# Patient Record
Sex: Female | Born: 1975 | Race: Black or African American | Hispanic: No | Smoking: Never smoker
Health system: Southern US, Community
[De-identification: ages and names within clinical notes are randomized; demographics above are authoritative.]

## PROBLEM LIST (undated history)

## (undated) DIAGNOSIS — J029 Acute pharyngitis, unspecified: Secondary | ICD-10-CM

## (undated) DIAGNOSIS — E876 Hypokalemia: Secondary | ICD-10-CM

## (undated) DIAGNOSIS — D509 Iron deficiency anemia, unspecified: Secondary | ICD-10-CM

## (undated) DIAGNOSIS — I10 Essential (primary) hypertension: Secondary | ICD-10-CM

## (undated) DIAGNOSIS — E010 Iodine-deficiency related diffuse (endemic) goiter: Secondary | ICD-10-CM

## (undated) DIAGNOSIS — F419 Anxiety disorder, unspecified: Secondary | ICD-10-CM

## (undated) DIAGNOSIS — D649 Anemia, unspecified: Secondary | ICD-10-CM

## (undated) DIAGNOSIS — F4310 Post-traumatic stress disorder, unspecified: Secondary | ICD-10-CM

## (undated) DIAGNOSIS — R61 Generalized hyperhidrosis: Secondary | ICD-10-CM

## (undated) HISTORY — DX: Anemia, unspecified: D64.9

## (undated) HISTORY — DX: Acute pharyngitis, unspecified: J02.9

## (undated) HISTORY — DX: Iodine-deficiency related diffuse (endemic) goiter: E01.0

## (undated) HISTORY — DX: Essential (primary) hypertension: I10

## (undated) HISTORY — DX: Iron deficiency anemia, unspecified: D50.9

## (undated) HISTORY — DX: Hypokalemia: E87.6

## (undated) HISTORY — DX: Generalized hyperhidrosis: R61

---

## 1997-07-15 ENCOUNTER — Emergency Department (HOSPITAL_COMMUNITY): Admission: EM | Admit: 1997-07-15 | Discharge: 1997-07-15 | Payer: Self-pay | Admitting: Emergency Medicine

## 1998-05-26 ENCOUNTER — Emergency Department (HOSPITAL_COMMUNITY): Admission: EM | Admit: 1998-05-26 | Discharge: 1998-05-26 | Payer: Self-pay | Admitting: Emergency Medicine

## 1998-11-06 ENCOUNTER — Encounter: Payer: Self-pay | Admitting: Emergency Medicine

## 1998-11-06 ENCOUNTER — Emergency Department (HOSPITAL_COMMUNITY): Admission: EM | Admit: 1998-11-06 | Discharge: 1998-11-06 | Payer: Self-pay | Admitting: Internal Medicine

## 1998-11-15 ENCOUNTER — Encounter: Admission: RE | Admit: 1998-11-15 | Discharge: 1998-11-15 | Payer: Self-pay | Admitting: Internal Medicine

## 1998-11-15 ENCOUNTER — Ambulatory Visit (HOSPITAL_COMMUNITY): Admission: RE | Admit: 1998-11-15 | Discharge: 1998-11-15 | Payer: Self-pay | Admitting: Internal Medicine

## 1998-11-16 ENCOUNTER — Ambulatory Visit (HOSPITAL_COMMUNITY): Admission: RE | Admit: 1998-11-16 | Discharge: 1998-11-16 | Payer: Self-pay | Admitting: *Deleted

## 1998-11-28 ENCOUNTER — Encounter: Admission: RE | Admit: 1998-11-28 | Discharge: 1998-11-28 | Payer: Self-pay | Admitting: Internal Medicine

## 1998-12-29 ENCOUNTER — Encounter: Admission: RE | Admit: 1998-12-29 | Discharge: 1998-12-29 | Payer: Self-pay | Admitting: Internal Medicine

## 1999-01-11 ENCOUNTER — Encounter: Admission: RE | Admit: 1999-01-11 | Discharge: 1999-01-11 | Payer: Self-pay | Admitting: Obstetrics

## 1999-06-05 ENCOUNTER — Emergency Department (HOSPITAL_COMMUNITY): Admission: EM | Admit: 1999-06-05 | Discharge: 1999-06-05 | Payer: Self-pay | Admitting: Emergency Medicine

## 1999-06-08 ENCOUNTER — Ambulatory Visit (HOSPITAL_COMMUNITY): Admission: RE | Admit: 1999-06-08 | Discharge: 1999-06-08 | Payer: Self-pay | Admitting: Emergency Medicine

## 1999-09-24 ENCOUNTER — Emergency Department (HOSPITAL_COMMUNITY): Admission: EM | Admit: 1999-09-24 | Discharge: 1999-09-24 | Payer: Self-pay | Admitting: *Deleted

## 1999-10-04 ENCOUNTER — Encounter: Payer: Self-pay | Admitting: Emergency Medicine

## 1999-10-04 ENCOUNTER — Emergency Department (HOSPITAL_COMMUNITY): Admission: EM | Admit: 1999-10-04 | Discharge: 1999-10-04 | Payer: Self-pay | Admitting: Emergency Medicine

## 1999-12-09 ENCOUNTER — Encounter: Payer: Self-pay | Admitting: Emergency Medicine

## 1999-12-09 ENCOUNTER — Emergency Department (HOSPITAL_COMMUNITY): Admission: EM | Admit: 1999-12-09 | Discharge: 1999-12-09 | Payer: Self-pay | Admitting: Emergency Medicine

## 2000-02-29 ENCOUNTER — Emergency Department (HOSPITAL_COMMUNITY): Admission: EM | Admit: 2000-02-29 | Discharge: 2000-02-29 | Payer: Self-pay | Admitting: Emergency Medicine

## 2000-11-22 ENCOUNTER — Emergency Department (HOSPITAL_COMMUNITY): Admission: EM | Admit: 2000-11-22 | Discharge: 2000-11-22 | Payer: Self-pay | Admitting: Emergency Medicine

## 2000-12-26 ENCOUNTER — Emergency Department (HOSPITAL_COMMUNITY): Admission: EM | Admit: 2000-12-26 | Discharge: 2000-12-27 | Payer: Self-pay | Admitting: Emergency Medicine

## 2001-01-06 ENCOUNTER — Emergency Department (HOSPITAL_COMMUNITY): Admission: EM | Admit: 2001-01-06 | Discharge: 2001-01-06 | Payer: Self-pay | Admitting: Emergency Medicine

## 2001-02-16 ENCOUNTER — Emergency Department (HOSPITAL_COMMUNITY): Admission: EM | Admit: 2001-02-16 | Discharge: 2001-02-16 | Payer: Self-pay | Admitting: Emergency Medicine

## 2002-05-29 ENCOUNTER — Emergency Department (HOSPITAL_COMMUNITY): Admission: EM | Admit: 2002-05-29 | Discharge: 2002-05-29 | Payer: Self-pay | Admitting: Emergency Medicine

## 2002-06-14 ENCOUNTER — Emergency Department (HOSPITAL_COMMUNITY): Admission: EM | Admit: 2002-06-14 | Discharge: 2002-06-14 | Payer: Self-pay | Admitting: Emergency Medicine

## 2002-09-16 ENCOUNTER — Emergency Department (HOSPITAL_COMMUNITY): Admission: EM | Admit: 2002-09-16 | Discharge: 2002-09-16 | Payer: Self-pay | Admitting: Emergency Medicine

## 2002-09-27 ENCOUNTER — Inpatient Hospital Stay (HOSPITAL_COMMUNITY): Admission: AD | Admit: 2002-09-27 | Discharge: 2002-09-27 | Payer: Self-pay | Admitting: Obstetrics & Gynecology

## 2002-09-28 ENCOUNTER — Inpatient Hospital Stay (HOSPITAL_COMMUNITY): Admission: AD | Admit: 2002-09-28 | Discharge: 2002-09-28 | Payer: Self-pay | Admitting: *Deleted

## 2002-09-29 ENCOUNTER — Inpatient Hospital Stay (HOSPITAL_COMMUNITY): Admission: AD | Admit: 2002-09-29 | Discharge: 2002-09-29 | Payer: Self-pay | Admitting: Obstetrics and Gynecology

## 2002-10-01 ENCOUNTER — Inpatient Hospital Stay (HOSPITAL_COMMUNITY): Admission: AD | Admit: 2002-10-01 | Discharge: 2002-10-01 | Payer: Self-pay | Admitting: Obstetrics & Gynecology

## 2002-10-03 ENCOUNTER — Inpatient Hospital Stay (HOSPITAL_COMMUNITY): Admission: AD | Admit: 2002-10-03 | Discharge: 2002-10-03 | Payer: Self-pay | Admitting: Obstetrics & Gynecology

## 2002-10-14 ENCOUNTER — Inpatient Hospital Stay (HOSPITAL_COMMUNITY): Admission: AD | Admit: 2002-10-14 | Discharge: 2002-10-14 | Payer: Self-pay | Admitting: Obstetrics & Gynecology

## 2002-10-24 ENCOUNTER — Inpatient Hospital Stay (HOSPITAL_COMMUNITY): Admission: AD | Admit: 2002-10-24 | Discharge: 2002-10-24 | Payer: Self-pay | Admitting: Obstetrics & Gynecology

## 2002-11-04 ENCOUNTER — Emergency Department (HOSPITAL_COMMUNITY): Admission: EM | Admit: 2002-11-04 | Discharge: 2002-11-04 | Payer: Self-pay | Admitting: Emergency Medicine

## 2003-05-01 ENCOUNTER — Emergency Department (HOSPITAL_COMMUNITY): Admission: EM | Admit: 2003-05-01 | Discharge: 2003-05-01 | Payer: Self-pay | Admitting: Emergency Medicine

## 2003-11-29 ENCOUNTER — Emergency Department (HOSPITAL_COMMUNITY): Admission: EM | Admit: 2003-11-29 | Discharge: 2003-11-29 | Payer: Self-pay | Admitting: Emergency Medicine

## 2004-02-15 ENCOUNTER — Emergency Department (HOSPITAL_COMMUNITY): Admission: EM | Admit: 2004-02-15 | Discharge: 2004-02-15 | Payer: Self-pay | Admitting: Emergency Medicine

## 2004-11-22 ENCOUNTER — Emergency Department (HOSPITAL_COMMUNITY): Admission: EM | Admit: 2004-11-22 | Discharge: 2004-11-22 | Payer: Self-pay | Admitting: Emergency Medicine

## 2005-07-16 ENCOUNTER — Emergency Department (HOSPITAL_COMMUNITY): Admission: EM | Admit: 2005-07-16 | Discharge: 2005-07-16 | Payer: Self-pay | Admitting: Emergency Medicine

## 2007-03-11 ENCOUNTER — Emergency Department (HOSPITAL_COMMUNITY): Admission: EM | Admit: 2007-03-11 | Discharge: 2007-03-11 | Payer: Self-pay | Admitting: Family Medicine

## 2007-05-18 ENCOUNTER — Emergency Department (HOSPITAL_COMMUNITY): Admission: EM | Admit: 2007-05-18 | Discharge: 2007-05-18 | Payer: Self-pay | Admitting: Emergency Medicine

## 2009-08-01 ENCOUNTER — Emergency Department (HOSPITAL_COMMUNITY): Admission: EM | Admit: 2009-08-01 | Discharge: 2009-08-01 | Payer: Self-pay | Admitting: Emergency Medicine

## 2010-03-13 ENCOUNTER — Emergency Department (HOSPITAL_COMMUNITY)
Admission: EM | Admit: 2010-03-13 | Discharge: 2010-03-13 | Disposition: A | Discharge status: Home / Self Care | Payer: Self-pay | Attending: Emergency Medicine | Admitting: Emergency Medicine

## 2010-03-13 DIAGNOSIS — J329 Chronic sinusitis, unspecified: Secondary | ICD-10-CM | POA: Insufficient documentation

## 2010-09-16 ENCOUNTER — Emergency Department (HOSPITAL_COMMUNITY)
Admission: EM | Admit: 2010-09-16 | Discharge: 2010-09-16 | Disposition: A | Discharge status: Home / Self Care | Payer: Self-pay | Attending: Emergency Medicine | Admitting: Emergency Medicine

## 2010-09-16 ENCOUNTER — Emergency Department (HOSPITAL_COMMUNITY): Payer: Self-pay

## 2010-09-16 DIAGNOSIS — R079 Chest pain, unspecified: Secondary | ICD-10-CM | POA: Insufficient documentation

## 2010-09-16 DIAGNOSIS — I517 Cardiomegaly: Secondary | ICD-10-CM | POA: Insufficient documentation

## 2010-09-16 LAB — DIFFERENTIAL
Eosinophils Absolute: 0.1 10*3/uL (ref 0.0–0.7)
Eosinophils Relative: 1 % (ref 0–5)
Lymphs Abs: 2.4 10*3/uL (ref 0.7–4.0)
Monocytes Relative: 3 % (ref 3–12)

## 2010-09-16 LAB — CBC
MCH: 26.2 pg (ref 26.0–34.0)
MCHC: 33 g/dL (ref 30.0–36.0)
MCV: 79.5 fL (ref 78.0–100.0)
Platelets: 267 10*3/uL (ref 150–400)
RDW: 14.5 % (ref 11.5–15.5)

## 2010-10-01 ENCOUNTER — Encounter: Payer: Self-pay | Admitting: *Deleted

## 2010-10-02 ENCOUNTER — Ambulatory Visit (INDEPENDENT_AMBULATORY_CARE_PROVIDER_SITE_OTHER): Payer: Self-pay | Admitting: Cardiology

## 2010-10-02 ENCOUNTER — Encounter: Payer: Self-pay | Admitting: Cardiology

## 2010-10-02 DIAGNOSIS — R072 Precordial pain: Secondary | ICD-10-CM

## 2010-10-02 DIAGNOSIS — E049 Nontoxic goiter, unspecified: Secondary | ICD-10-CM

## 2010-10-02 DIAGNOSIS — E010 Iodine-deficiency related diffuse (endemic) goiter: Secondary | ICD-10-CM | POA: Insufficient documentation

## 2010-10-02 DIAGNOSIS — R079 Chest pain, unspecified: Secondary | ICD-10-CM | POA: Insufficient documentation

## 2010-10-02 DIAGNOSIS — I10 Essential (primary) hypertension: Secondary | ICD-10-CM

## 2010-10-02 LAB — BASIC METABOLIC PANEL
BUN: 11 mg/dL (ref 6–23)
CO2: 29 mEq/L (ref 19–32)
Chloride: 107 mEq/L (ref 96–112)
Creatinine, Ser: 0.8 mg/dL (ref 0.4–1.2)
Glucose, Bld: 76 mg/dL (ref 70–99)

## 2010-10-02 LAB — TSH: TSH: 0.78 u[IU]/mL (ref 0.35–5.50)

## 2010-10-02 MED ORDER — LISINOPRIL-HYDROCHLOROTHIAZIDE 10-12.5 MG PO TABS: 1.0000 | ORAL_TABLET | Freq: Every day | ORAL | Status: DC

## 2010-10-02 NOTE — Assessment & Plan Note (Signed)
Possible thyromegaly on examination. Check TSH. Patient needs to establish with a primary care physician.

## 2010-10-02 NOTE — Assessment & Plan Note (Signed)
Symptoms atypical. She is also complaining of dyspnea on exertion. Schedule stress echocardiogram to quantify LV function and to exclude ischemia.

## 2010-10-02 NOTE — Patient Instructions (Signed)
Your physician recommends that you schedule a follow-up appointment in: 8 WEEKS  Your physician has requested that you have a stress echocardiogram. For further information please visit [email or web address] Please follow instruction sheet as given.   Your physician recommends that you return for lab work in: TODAY AND IN ONE WEEK  START  LISINOPRIL/HCTZ 10-12.5 MG ONCE DAILY  REFERRAL TO PRIMARY CARE TO ESTABLISH-[address]

## 2010-10-02 NOTE — Progress Notes (Signed)
HPI: 35 yo female for evaluation of chest pain. Seen in the ER 09/16/10 for evaluation of chest pain. Chest xray no edema; Hgb 11.5; Troponin neg. Patient states that on the day she was evaluated she developed left upper chest pain radiating to her left upper extremity. It was described as a sharp pain. It lasted one hour and resolved spontaneously. It increased with certain movements. No nausea or diaphoresis. She also had a headache and in the emergency room her blood pressure was noted to be in the 190s. She therefore presented for further evaluation. She has not had chest pain since then. She has noted dyspnea on exertion but no orthopnea, PND, pedal edema, palpitations.  Current Outpatient Prescriptions  Medication Sig Dispense Refill  . Aspirin-Acetaminophen-Caffeine (EXCEDRIN PO) Take by mouth as needed.        Marland Kitchen ibuprofen (ADVIL,MOTRIN) 200 MG tablet Take 200 mg by mouth every 6 (six) hours as needed.          No Known Allergies  Past Medical History  Diagnosis Date  . Hypertension     No past surgical history on file.  History   Social History  . Marital Status: Single    Spouse Name: N/A    Number of Children: 0  . Years of Education: N/A   Occupational History  .      Works at [employer]   Social History Main Topics  . Smoking status: Never Smoker   . Smokeless tobacco: Not on file  . Alcohol Use: No  . Drug Use: No  . Sexually Active: Not on file   Other Topics Concern  . Not on file   Social History Narrative  . No narrative on file    Family History  Problem Relation Age of Onset  . Hypertension Mother     ROS: no fevers or chills, productive cough, hemoptysis, dysphasia, odynophagia, melena, hematochezia, dysuria, hematuria, rash, seizure activity, orthopnea, PND, pedal edema, claudication. Remaining systems are negative.  Physical Exam: General:  Well developed/well nourished in NAD Skin warm/dry Patient not depressed No peripheral  clubbing Back-normal HEENT-normal/normal eyelids Neck supple/normal carotid upstroke bilaterally; no bruits; no JVD; Possible thyromegaly chest - CTA/ normal expansion CV - RRR/normal S1 and S2; no rubs or gallops;  PMI nondisplaced; 1/6 systolic ejection murmur Abdomen -NT/ND, no HSM, no mass, + bowel sounds, no bruit 2+ femoral pulses, no bruits Ext-no edema, chords, 2+ DP Neuro-grossly nonfocal  ECG 09/16/10 - Sinus with RV conduction delay

## 2010-10-02 NOTE — Assessment & Plan Note (Signed)
Check TSH, potassium and renal function. Add lisinopril HCT 10/12.5 daily. Check potassium and renal function in one week. Advanced medications as needed. Patient instructed on lifestyle modification.

## 2010-10-03 LAB — CBC
HCT: 37.5
MCV: 84.8
Platelets: 259
RBC: 4.42
WBC: 7.1

## 2010-10-03 LAB — BASIC METABOLIC PANEL
BUN: 12
CO2: 27
Calcium: 9
GFR calc non Af Amer: >60
Glucose, Bld: 79
Sodium: 138

## 2010-10-03 LAB — DIFFERENTIAL
Eosinophils Absolute: 0.1
Lymphocytes Relative: 41
Lymphs Abs: 2.9
Monocytes Relative: 6
Neutrophils Relative %: 52

## 2010-10-12 ENCOUNTER — Other Ambulatory Visit: Payer: Self-pay | Admitting: *Deleted

## 2010-10-12 ENCOUNTER — Other Ambulatory Visit (HOSPITAL_COMMUNITY): Payer: Self-pay

## 2010-10-12 ENCOUNTER — Other Ambulatory Visit (HOSPITAL_COMMUNITY): Payer: Self-pay | Admitting: Radiology

## 2010-10-18 ENCOUNTER — Other Ambulatory Visit (HOSPITAL_COMMUNITY): Payer: Self-pay

## 2010-10-18 ENCOUNTER — Other Ambulatory Visit (HOSPITAL_COMMUNITY): Payer: Self-pay | Admitting: Radiology

## 2010-10-18 ENCOUNTER — Other Ambulatory Visit: Payer: Self-pay | Admitting: *Deleted

## 2010-10-29 ENCOUNTER — Telehealth: Payer: Self-pay

## 2010-10-29 NOTE — Telephone Encounter (Signed)
Pt was a NOS for Echo on 10/11 called to rsc appt. No answer unable to leave message.Scarlette Ar

## 2010-11-26 ENCOUNTER — Encounter: Payer: Self-pay | Admitting: *Deleted

## 2010-11-26 ENCOUNTER — Encounter: Payer: Self-pay | Admitting: Cardiology

## 2010-11-27 ENCOUNTER — Ambulatory Visit: Payer: Self-pay | Admitting: Cardiology

## 2010-12-07 ENCOUNTER — Ambulatory Visit: Payer: Self-pay | Admitting: Internal Medicine

## 2011-01-02 ENCOUNTER — Encounter: Payer: Self-pay | Admitting: Cardiology

## 2011-03-13 ENCOUNTER — Encounter: Payer: Self-pay | Admitting: Cardiology

## 2012-08-21 ENCOUNTER — Emergency Department (HOSPITAL_COMMUNITY): Payer: Self-pay

## 2012-08-21 ENCOUNTER — Emergency Department (HOSPITAL_COMMUNITY)
Admission: EM | Admit: 2012-08-21 | Discharge: 2012-08-21 | Disposition: A | Discharge status: Home / Self Care | Payer: Self-pay | Attending: Emergency Medicine | Admitting: Emergency Medicine

## 2012-08-21 ENCOUNTER — Encounter (HOSPITAL_COMMUNITY): Payer: Self-pay | Admitting: Neurology

## 2012-08-21 DIAGNOSIS — E049 Nontoxic goiter, unspecified: Secondary | ICD-10-CM | POA: Insufficient documentation

## 2012-08-21 DIAGNOSIS — I10 Essential (primary) hypertension: Secondary | ICD-10-CM | POA: Insufficient documentation

## 2012-08-21 DIAGNOSIS — Z79899 Other long term (current) drug therapy: Secondary | ICD-10-CM | POA: Insufficient documentation

## 2012-08-21 DIAGNOSIS — D649 Anemia, unspecified: Secondary | ICD-10-CM | POA: Insufficient documentation

## 2012-08-21 DIAGNOSIS — R079 Chest pain, unspecified: Secondary | ICD-10-CM

## 2012-08-21 LAB — CBC WITH DIFFERENTIAL/PLATELET
Basophils Absolute: 0.1 10*3/uL (ref 0.0–0.1)
Basophils Relative: 1 % (ref 0–1)
Eosinophils Absolute: 0.1 10*3/uL (ref 0.0–0.7)
Eosinophils Relative: 2 % (ref 0–5)
HCT: 25.1 % — ABNORMAL LOW (ref 36.0–46.0)
Hemoglobin: 7.7 g/dL — ABNORMAL LOW (ref 12.0–15.0)
Lymphocytes Relative: 35 % (ref 12–46)
Lymphs Abs: 2 10*3/uL (ref 0.7–4.0)
MCH: 19.7 pg — ABNORMAL LOW (ref 26.0–34.0)
MCHC: 30.7 g/dL (ref 30.0–36.0)
MCV: 64.4 fL — ABNORMAL LOW (ref 78.0–100.0)
Monocytes Absolute: 0.3 10*3/uL (ref 0.1–1.0)
Monocytes Relative: 6 % (ref 3–12)
Neutro Abs: 3.2 10*3/uL (ref 1.7–7.7)
Neutrophils Relative %: 56 % (ref 43–77)
Platelets: 288 10*3/uL (ref 150–400)
RBC: 3.9 MIL/uL (ref 3.87–5.11)
RDW: 19.1 % — ABNORMAL HIGH (ref 11.5–15.5)
WBC: 5.7 10*3/uL (ref 4.0–10.5)

## 2012-08-21 LAB — BASIC METABOLIC PANEL
BUN: 10 mg/dL (ref 6–23)
CO2: 25 mEq/L (ref 19–32)
Calcium: 8.8 mg/dL (ref 8.4–10.5)
Chloride: 106 mEq/L (ref 96–112)
Creatinine, Ser: 0.74 mg/dL (ref 0.50–1.10)
GFR calc Af Amer: >90 mL/min (ref >90)
GFR calc non Af Amer: >90 mL/min (ref >90)
Glucose, Bld: 107 mg/dL — ABNORMAL HIGH (ref 70–99)
Potassium: 3.5 mEq/L (ref 3.5–5.1)
Sodium: 139 mEq/L (ref 135–145)

## 2012-08-21 LAB — POCT I-STAT TROPONIN I
Troponin i, poc: 0 ng/mL (ref 0.00–0.08)
Troponin i, poc: 0.01 ng/mL (ref 0.00–0.08)

## 2012-08-21 MED ORDER — FERROUS SULFATE 325 (65 FE) MG PO TABS: 325.0000 mg | ORAL_TABLET | Freq: Every day | ORAL | Status: DC

## 2012-08-21 MED ORDER — LISINOPRIL-HYDROCHLOROTHIAZIDE 10-12.5 MG PO TABS: 1.0000 | ORAL_TABLET | Freq: Every day | ORAL | Status: DC

## 2012-08-21 MED ORDER — DOCUSATE SODIUM 100 MG PO CAPS: 100.0000 mg | ORAL_CAPSULE | Freq: Two times a day (BID) | ORAL | Status: DC

## 2012-08-21 NOTE — ED Provider Notes (Signed)
Medical screening examination/treatment/procedure(s) were performed by non-physician practitioner and as supervising physician I was immediately available for consultation/collaboration.  Raeford Razor, MD 08/21/12 [phone]

## 2012-08-21 NOTE — ED Notes (Signed)
Per EMS- reporting 2 days ago cp radiating to left arm, "someone sitting on my chest with sharp pain down my left arm". 190/110 initial BP, 324 aspirin, 2 nitro. Reporting 5/10 CP. Current BP 130/90, HR 68. Pt dx with HTN a year ago but never took her meds.

## 2012-08-21 NOTE — ED Provider Notes (Signed)
CSN: [id number]     Arrival date & time 08/21/12  1242 History     First MD Initiated Contact with Patient 08/21/12 1252     Chief Complaint  Patient presents with  . Chest Pain   (Consider location/radiation/quality/duration/timing/severity/associated sxs/prior Treatment) HPI Patient is a 37 year old female who presents emergency Department with complaint of chest pain. Patient reports chest pain is substernal. It was like pressure. States chest pain is intermittent for several weeks. Reports she is unsure what causes the pain to come on. States it is not exertional. The pain occurs at random. Patient denies associated shortness of breath, nausea, dizziness, diaphoresis. Patient has not tried any medications for it at home. Patient states she normally lays down when she feels the pain which makes pain better. Patient denies any prior cardiac history. Patient states she has extensive family history of high blood pressure. Patient denies any cardiac problems in her immediate family history. Patient denies any recent travel or surgeries. Patient denies any swelling in her extremities. The patient denies any fever chills or cough. Patient states he does not see a primary care doctor regularly and has not had her blood checked in the last 3 years. She states she does have history of elevated blood pressure but does not take any medications for it. Past Medical History  Diagnosis Date  . Hypertension   . Thyromegaly   . Chest pain   . Pharyngitis    History reviewed. No pertinent past surgical history. Family History  Problem Relation Age of Onset  . Hypertension Mother    History  Substance Use Topics  . Smoking status: Never Smoker   . Smokeless tobacco: Not on file  . Alcohol Use: No   OB History   Grav Para Term Preterm Abortions TAB SAB Ect Mult Living                 Review of Systems  Constitutional: Negative for fever and chills.  HENT: Negative for neck pain and neck  stiffness.   Respiratory: Positive for chest tightness. Negative for cough, shortness of breath, wheezing and stridor.   Cardiovascular: Positive for chest pain. Negative for palpitations and leg swelling.  Genitourinary: Negative for dysuria and flank pain.  Neurological: Negative for dizziness, weakness, numbness and headaches.  All other systems reviewed and are negative.    Allergies  Review of patient's allergies indicates no known allergies.  Home Medications   Current Outpatient Rx  Name  Route  Sig  Dispense  Refill  . Aspirin-Acetaminophen-Caffeine (EXCEDRIN PO)   Oral   Take by mouth as needed.           Marland Kitchen ibuprofen (ADVIL,MOTRIN) 200 MG tablet   Oral   Take 200 mg by mouth every 6 (six) hours as needed.           Marland Kitchen EXPIRED: lisinopril-hydrochlorothiazide (PRINZIDE,ZESTORETIC) 10-12.5 MG per tablet   Oral   Take 1 tablet by mouth daily.   30 tablet   12    BP 156/94  Pulse 60  Temp(Src) 98.9 F (37.2 C) (Oral)  Resp 17  SpO2 99%  LMP 08/03/2012 Physical Exam  Nursing note and vitals reviewed. Constitutional: She appears well-developed and well-nourished. No distress.  HENT:  Head: Normocephalic.  Eyes: Conjunctivae are normal.  Neck: Neck supple.  Cardiovascular: Normal rate, regular rhythm and normal heart sounds.   Pulmonary/Chest: Effort normal and breath sounds normal. No respiratory distress. She has no wheezes. She has no rales.  Abdominal: Soft. Bowel sounds are normal. She exhibits no distension. There is no tenderness. There is no rebound.  Musculoskeletal: She exhibits no edema.  Neurological: She is alert.  Skin: Skin is warm and dry.  Psychiatric: She has a normal mood and affect. Her behavior is normal.    ED Course   Procedures (including critical care time)   Date: 08/21/2012  Rate: 61  Rhythm: normal sinus rhythm  QRS Axis: normal  Intervals: normal  ST/T Wave abnormalities: normal  Conduction Disutrbances:none  Narrative  Interpretation:   Old EKG Reviewed: none available   No results found.  1. Hypertension   2. Chest pain   3. Anemia     MDM  Patient in the emergency department with atypical chest pain. Patient states pain is for the last 2 weeks, and goes. Patient describes pain as pressure-like. Pain is not associated with any other symptoms such as nausea, dizziness, diaphoresis. On exam she is in no distress. She denies any current pain. She has no major cardiac risk factors other than history of hypertension for which she does not take any medications. It appears that she was prescribed lisinopril and hydrochlorothiazide the patient states she's not taking it. I will refill it for the patient. Patient's EKG in the emergency department is normal. The patient's lab work is unremarkable for the exception of her hemoglobin. Patient denies any dizziness or weakness. She does admit to having very heavy menstrual periods for the last 2 years. Patient states she has not seen her PCP or OB/GYN. Patient's vital signs in the emergency department are normal except for elevated blood pressure that is in 150s/70s. Patient was monitored for 3 hours and second troponin was obtained which is also negative. Patient will be discharged home with close followup with her primary care doctor for possible outpatient stress test as well as close followup for her blood pressure and anemia. The patient will be started on iron. Patient is instructed to return if her symptoms are worsening.  Filed Vitals:   08/21/12 1248 08/21/12 1358 08/21/12 1401 08/21/12 1430  BP: 156/94 153/77 153/77 153/88  Pulse: 60 52 61 72  Temp: 98.9 F (37.2 C) 99.1 F (37.3 C)    TempSrc: Oral Oral    Resp: 17 19 24 20   SpO2: 99% 100% 100% 100%        Lottie Mussel, PA-C 08/21/12 1629

## 2014-03-01 ENCOUNTER — Emergency Department (HOSPITAL_COMMUNITY): Payer: Self-pay

## 2014-03-01 ENCOUNTER — Encounter (HOSPITAL_COMMUNITY): Payer: Self-pay

## 2014-03-01 ENCOUNTER — Inpatient Hospital Stay (HOSPITAL_COMMUNITY)
Admission: EM | Admit: 2014-03-01 | Discharge: 2014-03-02 | DRG: 812 | Disposition: A | Discharge status: Home / Self Care | Payer: Self-pay | Attending: Internal Medicine | Admitting: Internal Medicine

## 2014-03-01 DIAGNOSIS — D509 Iron deficiency anemia, unspecified: Principal | ICD-10-CM | POA: Diagnosis present

## 2014-03-01 DIAGNOSIS — D649 Anemia, unspecified: Secondary | ICD-10-CM | POA: Diagnosis present

## 2014-03-01 DIAGNOSIS — R0602 Shortness of breath: Secondary | ICD-10-CM | POA: Diagnosis present

## 2014-03-01 DIAGNOSIS — I10 Essential (primary) hypertension: Secondary | ICD-10-CM | POA: Diagnosis present

## 2014-03-01 DIAGNOSIS — E876 Hypokalemia: Secondary | ICD-10-CM | POA: Diagnosis present

## 2014-03-01 DIAGNOSIS — N920 Excessive and frequent menstruation with regular cycle: Secondary | ICD-10-CM | POA: Diagnosis present

## 2014-03-01 DIAGNOSIS — R0600 Dyspnea, unspecified: Secondary | ICD-10-CM

## 2014-03-01 LAB — CBC
HCT: 24.4 % — ABNORMAL LOW (ref 36.0–46.0)
HEMOGLOBIN: 6.8 g/dL — AB (ref 12.0–15.0)
MCH: 17.6 pg — ABNORMAL LOW (ref 26.0–34.0)
MCHC: 27.9 g/dL — ABNORMAL LOW (ref 30.0–36.0)
MCV: 63 fL — ABNORMAL LOW (ref 78.0–100.0)
PLATELETS: 374 10*3/uL (ref 150–400)
RBC: 3.87 MIL/uL (ref 3.87–5.11)
RDW: 22.2 % — ABNORMAL HIGH (ref 11.5–15.5)
WBC: 7.3 10*3/uL (ref 4.0–10.5)

## 2014-03-01 LAB — HEMOGLOBIN AND HEMATOCRIT, BLOOD
HEMATOCRIT: 24.3 % — AB (ref 36.0–46.0)
Hemoglobin: 6.8 g/dL — CL (ref 12.0–15.0)

## 2014-03-01 LAB — BASIC METABOLIC PANEL
Anion gap: 7 (ref 5–15)
BUN: 9 mg/dL (ref 6–23)
CALCIUM: 9.2 mg/dL (ref 8.4–10.5)
CO2: 25 mmol/L (ref 19–32)
CREATININE: 0.67 mg/dL (ref 0.50–1.10)
Chloride: 107 mmol/L (ref 96–112)
GFR calc non Af Amer: >90 mL/min (ref >90)
GLUCOSE: 95 mg/dL (ref 70–99)
Potassium: 3.3 mmol/L — ABNORMAL LOW (ref 3.5–5.1)
Sodium: 139 mmol/L (ref 135–145)

## 2014-03-01 LAB — I-STAT TROPONIN, ED: Troponin i, poc: 0 ng/mL (ref 0.00–0.08)

## 2014-03-01 LAB — PREPARE RBC (CROSSMATCH)

## 2014-03-01 LAB — ABO/RH: ABO/RH(D): O POS

## 2014-03-01 LAB — POC OCCULT BLOOD, ED: Fecal Occult Bld: NEGATIVE

## 2014-03-01 LAB — RETICULOCYTES
RBC.: 3.9 MIL/uL (ref 3.87–5.11)
RETIC COUNT ABSOLUTE: 39 10*3/uL (ref 19.0–186.0)
RETIC CT PCT: 1 % (ref 0.4–3.1)

## 2014-03-01 MED ORDER — ACETAMINOPHEN 650 MG RE SUPP: 650.0000 mg | Freq: Four times a day (QID) | RECTAL | Status: DC | PRN

## 2014-03-01 MED ORDER — SODIUM CHLORIDE 0.9 % IJ SOLN: 3.0000 mL | INTRAMUSCULAR | Status: DC | PRN

## 2014-03-01 MED ORDER — SODIUM CHLORIDE 0.9 % IJ SOLN
3.0000 mL | Freq: Two times a day (BID) | INTRAMUSCULAR | Status: DC
  Administered 2014-03-01 – 2014-03-02 (×2): 3 mL via INTRAVENOUS

## 2014-03-01 MED ORDER — SODIUM CHLORIDE 0.9 % IV SOLN: Freq: Once | INTRAVENOUS | Status: DC

## 2014-03-01 MED ORDER — ACETAMINOPHEN 325 MG PO TABS
650.0000 mg | ORAL_TABLET | Freq: Four times a day (QID) | ORAL | Status: DC | PRN
  Administered 2014-03-02: 650 mg via ORAL
  Filled 2014-03-01: qty 2

## 2014-03-01 MED ORDER — SODIUM CHLORIDE 0.9 % IV SOLN: 250.0000 mL | INTRAVENOUS | Status: DC | PRN

## 2014-03-01 MED ORDER — POTASSIUM CHLORIDE CRYS ER 20 MEQ PO TBCR: 40.0000 meq | EXTENDED_RELEASE_TABLET | Freq: Once | ORAL | Status: DC

## 2014-03-01 MED ORDER — FERROUS SULFATE 325 (65 FE) MG PO TABS
325.0000 mg | ORAL_TABLET | Freq: Three times a day (TID) | ORAL | Status: DC
  Administered 2014-03-02 (×2): 325 mg via ORAL
  Filled 2014-03-01 (×4): qty 1

## 2014-03-01 NOTE — ED Notes (Signed)
Patient transported to X-ray 

## 2014-03-01 NOTE — ED Notes (Signed)
Pt c/o tremors, SOB, and rash x 3 days.  Denies pain.  Pt reports rash on bilateral breasts and abdomen. Denies new lotions, detergents, or creams.  Pt able to speak in full sentences w/o difficulty.  O2 Sat 100%.

## 2014-03-01 NOTE — Progress Notes (Addendum)
CRITICAL VALUE ALERT  Critical value received:  HGB 6.8  Date of notification:  03/01/14  Time of notification:  1827  Critical value read back:yes  Nurse who received alert:  Katheran AweJessica Kepler Mccabe,RN   MD notified (1st page):  Dr. Cena BentonVega   Time of first page:  1828  MD notified (2nd page):  Time of second page:  Responding MD:  Cena BentonVega Time MD responded:  [phone]

## 2014-03-01 NOTE — ED Provider Notes (Addendum)
CSN: [id number]     Arrival date & time 03/01/14  1156 History   First MD Initiated Contact with Patient 03/01/14 1456     Chief Complaint  Patient presents with  . Shortness of Breath  . Rash     (Consider location/radiation/quality/duration/timing/severity/associated sxs/prior Treatment) HPI Comments: 39 year old female with high blood pressure and anemia history presents with shortness of breath, intermittent tremors and rash for the past 3 days. Patient has shortness of breath random timing, not specifically exertional, sometimes while standing. Patient has no known cardiac or blood clot history.Patient denies blood clot history, active cancer, recent major trauma or surgery, unilateral leg swelling/ pain, recent long travel, hemoptysis or oral contraceptives. Patient denies any blood in her stools or vaginal bleeding. No syncope however mild lightheadedness intermittent. Patient had nonspecific rash abdomen and lower chest no new medicines, detergents or creams.  Patient is a 39 y.o. female presenting with shortness of breath and rash. The history is provided by the patient.  Shortness of Breath Associated symptoms: rash   Associated symptoms: no abdominal pain, no chest pain, no fever, no headaches, no neck pain and no vomiting   Rash Associated symptoms: shortness of breath   Associated symptoms: no abdominal pain, no fever, no headaches and not vomiting     Past Medical History  Diagnosis Date  . Hypertension   . Thyromegaly   . Chest pain   . Pharyngitis    History reviewed. No pertinent past surgical history. Family History  Problem Relation Age of Onset  . Hypertension Mother    History  Substance Use Topics  . Smoking status: Never Smoker   . Smokeless tobacco: Never Used  . Alcohol Use: No   OB History    No data available     Review of Systems  Constitutional: Negative for fever and chills.  HENT: Negative for congestion.   Eyes: Negative for visual  disturbance.  Respiratory: Positive for shortness of breath.   Cardiovascular: Negative for chest pain.  Gastrointestinal: Negative for vomiting and abdominal pain.  Genitourinary: Negative for dysuria and flank pain.  Musculoskeletal: Negative for back pain, neck pain and neck stiffness.  Skin: Positive for rash.  Neurological: Positive for tremors and light-headedness. Negative for seizures, weakness and headaches.      Allergies  Review of patient's allergies indicates no known allergies.  Home Medications   Prior to Admission medications   Medication Sig Start Date End Date Taking? Authorizing Provider  ibuprofen (ADVIL,MOTRIN) 200 MG tablet Take 200 mg by mouth every 6 (six) hours as needed.     Yes Historical Provider, MD  docusate sodium (COLACE) 100 MG capsule Take 1 capsule (100 mg total) by mouth every 12 (twelve) hours. Patient not taking: Reported on 03/01/2014 08/21/12   Tatyana A Kirichenko, PA-C  ferrous sulfate 325 (65 FE) MG tablet Take 1 tablet (325 mg total) by mouth daily. Patient not taking: Reported on 03/01/2014 08/21/12   Tatyana A Kirichenko, PA-C  lisinopril-hydrochlorothiazide (PRINZIDE) 10-12.5 MG per tablet Take 1 tablet by mouth daily. Patient not taking: Reported on 03/01/2014 08/21/12   Tatyana A Kirichenko, PA-C  lisinopril-hydrochlorothiazide (PRINZIDE,ZESTORETIC) 10-12.5 MG per tablet Take 1 tablet by mouth daily. 10/02/10 10/02/11  Lewayne Bunting, MD   BP 112/74 mmHg  Pulse 67  Temp(Src) 98.2 F (36.8 C) (Oral)  Resp 18  Ht  (1.727 m)  Wt 235 lb 8 oz (106.822 kg)  BMI 35.82 kg/m2  SpO2 99%  LMP 02/02/2014 (Approximate)  Physical Exam  Constitutional: She is oriented to person, place, and time. She appears well-developed and well-nourished.  HENT:  Head: Normocephalic and atraumatic.  Pale conjunctiva  Eyes: Conjunctivae are normal. Right eye exhibits no discharge. Left eye exhibits no discharge.  Neck: Normal range of motion. Neck supple.  No tracheal deviation present.  Cardiovascular: Normal rate and regular rhythm.   Pulmonary/Chest: Effort normal and breath sounds normal.  Abdominal: Soft. She exhibits no distension. There is no tenderness. There is no guarding.  Musculoskeletal: She exhibits no edema.  Neurological: She is alert and oriented to person, place, and time.  Skin: Skin is warm. No rash noted.  Patient has papular/mild raised rash in patches lower abdomen and axilla bilateral, no crepitus, no warmth or induration. No satellite lesion seen, no purpura.  Psychiatric: She has a normal mood and affect.  Nursing note and vitals reviewed.   ED Course  Procedures (including critical care time) Labs Review Labs Reviewed  BASIC METABOLIC PANEL - Abnormal; Notable for the following:    Potassium 3.3 (*)    All other components within normal limits  CBC - Abnormal; Notable for the following:    Hemoglobin 6.8 (*)    HCT 24.4 (*)    MCV 63.0 (*)    MCH 17.6 (*)    MCHC 27.9 (*)    RDW 22.2 (*)    All other components within normal limits  HEMOGLOBIN AND HEMATOCRIT, BLOOD - Abnormal; Notable for the following:    Hemoglobin 6.8 (*)    HCT 24.3 (*)    All other components within normal limits  RETICULOCYTES  VITAMIN B12  FOLATE  IRON AND TIBC  FERRITIN  BASIC METABOLIC PANEL  CBC  I-STAT TROPOININ, ED  POC OCCULT BLOOD, ED  TYPE AND SCREEN  PREPARE RBC (CROSSMATCH)  PREPARE RBC (CROSSMATCH)  ABO/RH    Imaging Review Dg Chest 2 View  03/01/2014   CLINICAL DATA:  Shortness of breath, rash for 3 days  EXAM: CHEST  2 VIEW  COMPARISON:  08/21/2012  FINDINGS: Cardiomediastinal silhouette is stable. No acute infiltrate or pleural effusion. No pulmonary edema. Mild degenerative changes thoracic spine.  IMPRESSION: No active cardiopulmonary disease.   Electronically Signed   By: Natasha Mead M.D.   On: 03/01/2014 15:43     EKG Interpretation   Date/Time:  Tuesday March 01 2014 14:50:33 EST Ventricular  Rate:  61 PR Interval:  192 QRS Duration: 107 QT Interval:  423 QTC Calculation: 426 R Axis:   42 Text Interpretation:  Sinus rhythm Baseline wander in lead(s) V2 V3 V4 V5  Confirmed by Kellon Chalk  MD, Cherre Kothari ([id number]) on 03/01/2014 3:55:23 PM      MDM   Final diagnoses:  Dyspnea  Symptomatic anemia  Hypokalemia  Symptomatic anemia  Patient presents with intermittent nonspecific shortness of breath, well-appearing on exam, vitals normal, lungs clear, chest x-ray reviewed no acute findings, EKG poor quality however no acute findings appreciated. Hemoglobin returned at 6.8 worse than her last level in the sevens. Her symptoms are likely related to hemoglobin likely iron deficient. Patient does not have a primary doctor. Patient tried hospitalist to discuss observation versus arranging outpatient blood transfusion and further workup.  Discussed with hospitalist who agreed with admission with symptomatic anemia and patient does not have a primary care doctor.  The patients results and plan were reviewed and discussed.   Any x-rays performed were personally reviewed by myself.   Differential diagnosis were considered with the presenting  HPI.  Medications  sodium chloride 0.9 % injection 3 mL (3 mLs Intravenous Given 03/01/14 2200)  sodium chloride 0.9 % injection 3 mL (not administered)  0.9 %  sodium chloride infusion (not administered)  acetaminophen (TYLENOL) tablet 650 mg (not administered)    Or  acetaminophen (TYLENOL) suppository 650 mg (not administered)  0.9 %  sodium chloride infusion (not administered)  potassium chloride SA (K-DUR,KLOR-CON) CR tablet 40 mEq (not administered)  ferrous sulfate tablet 325 mg (not administered)    Filed Vitals:   03/01/14 2118 03/01/14 2157 03/01/14 2350 03/02/14 0034  BP: 123/72 118/66 122/87 112/74  Pulse: 68 66 58 67  Temp: 98.6 F (37 C) 98.6 F (37 C) 98.5 F (36.9 C) 98.2 F (36.8 C)  TempSrc: Oral Oral Oral Oral  Resp: 18 18 18 18    Height:      Weight:      SpO2: 100% 100% 100% 99%    Final diagnoses:  Dyspnea  Symptomatic anemia  Hypokalemia    Admission/ observation were discussed with the admitting physician, patient and/or family and they are comfortable with the plan.       Enid SkeensJoshua M Olivette Beckmann, MD 03/02/14 0100  Enid SkeensJoshua M Edwyn Inclan, MD 03/02/14 0100

## 2014-03-01 NOTE — H&P (Signed)
[facility] History and Physical  [name]:[id number] DOB: 1975/06/15 DOA: 03/01/2014  Referring physician: Jodi Mourning PCP: No PCP Per Patient   Chief Complaint: SOB with activity  HPI: Stacie Gibson is a 39 y.o. female  With history of iron deficiency anemia and hypertension. Presents to the ED complaining of shortness of breath with activity most noticeable within the last 3 days. She reports that at times she has heavy menstrual periods. She is currently not on any iron supplementation and was treated last year with iron for her anemia. Other than shortness of breath with activity patient has no other complaints and states she is generally healthy.  While in the emergency department patient had a hemoglobin of 6.8 we were consulted for further medical evaluation and recommendations.   Review of Systems:  Constitutional:  No weight loss, night sweats, Fevers, chills, fatigue.  HEENT:  No headaches, Difficulty swallowing,Tooth/dental problems,Sore throat,  No sneezing, itching, ear ache, nasal congestion, post nasal drip,  Cardio-vascular:  No chest pain, Orthopnea, PND, swelling in lower extremities, anasarca, dizziness, palpitations  GI:  No heartburn, indigestion, abdominal pain, nausea, vomiting, diarrhea, change in bowel habits, loss of appetite  Resp:  + shortness of breath with exertion not at rest. No excess mucus, no productive cough, No non-productive cough, No coughing up of blood.No change in color of mucus.No wheezing.No chest wall deformity  Skin:  no rash or lesions.  GU:  no dysuria, change in color of urine, no urgency or frequency. No flank pain.  Musculoskeletal:  No joint pain or swelling. No decreased range of motion. No back pain.  Psych:  No change in mood or affect. No depression or anxiety. No memory loss.   Past Medical History  Diagnosis Date  . Hypertension   . Thyromegaly   . Chest pain   . Pharyngitis    History reviewed. No  pertinent past surgical history. Social History:  reports that she has never smoked. She has never used smokeless tobacco. She reports that she does not drink alcohol or use illicit drugs.  No Known Allergies  Family History  Problem Relation Age of Onset  . Hypertension Mother     Prior to Admission medications   Medication Sig Start Date End Date Taking? Authorizing Provider  ibuprofen (ADVIL,MOTRIN) 200 MG tablet Take 200 mg by mouth every 6 (six) hours as needed.     Yes Historical Provider, MD  docusate sodium (COLACE) 100 MG capsule Take 1 capsule (100 mg total) by mouth every 12 (twelve) hours. Patient not taking: Reported on 03/01/2014 08/21/12   Tatyana A Kirichenko, PA-C  ferrous sulfate 325 (65 FE) MG tablet Take 1 tablet (325 mg total) by mouth daily. Patient not taking: Reported on 03/01/2014 08/21/12   Tatyana A Kirichenko, PA-C  lisinopril-hydrochlorothiazide (PRINZIDE) 10-12.5 MG per tablet Take 1 tablet by mouth daily. Patient not taking: Reported on 03/01/2014 08/21/12   Tatyana A Kirichenko, PA-C  lisinopril-hydrochlorothiazide (PRINZIDE,ZESTORETIC) 10-12.5 MG per tablet Take 1 tablet by mouth daily. 10/02/10 10/02/11  Lewayne Bunting, MD   Physical Exam: Filed Vitals:   03/01/14 1215 03/01/14 1433 03/01/14 1532  BP: 137/95 130/77 106/67  Pulse: 88 67 60  Temp: 98 F (36.7 C) 98.3 F (36.8 C)   TempSrc: Oral Oral   Resp: SpO2: 99% 100% 100%    Wt Readings from Last 3 Encounters:  10/02/10 111.131 kg (245 lb)    General:  Appears calm and comfortable Eyes:  PERRL, normal lids, irises & pale conjunctiva ENT: grossly normal hearing, lips & tongue Neck: no LAD, masses or thyromegaly Cardiovascular: RRR, no m/r/g. No LE edema. Telemetry: SR, no arrhythmias  Respiratory: CTA bilaterally, no w/r/r. Normal respiratory effort. Abdomen: soft, nt, nd Skin: no rash or induration seen on limited exam Musculoskeletal: grossly normal tone BUE/BLE Psychiatric:  grossly normal mood and affect, speech fluent and appropriate Neurologic: grossly non-focal.          Labs on Admission:  Basic Metabolic Panel:  Recent Labs Lab 03/01/14 1443  NA 139  K 3.3*  CL 107  CO2 25  GLUCOSE 95  BUN 9  CREATININE 0.67  CALCIUM 9.2   Liver Function Tests: No results for input(s): AST, ALT, ALKPHOS, BILITOT, PROT, ALBUMIN in the last 168 hours. No results for input(s): LIPASE, AMYLASE in the last 168 hours. No results for input(s): AMMONIA in the last 168 hours. CBC:  Recent Labs Lab 03/01/14 1443  WBC 7.3  HGB 6.8*  HCT 24.4*  MCV 63.0*  PLT 374   Cardiac Enzymes: No results for input(s): CKTOTAL, CKMB, CKMBINDEX, TROPONINI in the last 168 hours.  BNP (last 3 results) No results for input(s): BNP in the last 8760 hours.  ProBNP (last 3 results) No results for input(s): PROBNP in the last 8760 hours.  CBG: No results for input(s): GLUCAP in the last 168 hours.  Radiological Exams on Admission: Dg Chest 2 View  03/01/2014   CLINICAL DATA:  Shortness of breath, rash for 3 days  EXAM: CHEST  2 VIEW  COMPARISON:  08/21/2012  FINDINGS: Cardiomediastinal silhouette is stable. No acute infiltrate or pleural effusion. No pulmonary edema. Mild degenerative changes thoracic spine.  IMPRESSION: No active cardiopulmonary disease.   Electronically Signed   By: Natasha MeadLiviu  Pop M.D.   On: 03/01/2014 15:43    EKG: Independently reviewed. Sinus rhythm with no st elevations or depressions with artifact  Assessment/Plan Active Problems:   Symptomatic anemia - Most likely combination of heavy periods and iron deficiency - Will obtain anemia panel - Patient has history of iron deficiency as such will place on ferrous sulfate    Hypokalemia - Will replace orally and reassess next am  Code Status: full DVT Prophylaxis: Family Communication: none at bedside Disposition Plan: Most likely d/c after transfusion  Time spent: > 45 minutes  Penny PiaVEGA,  Shaun Zuccaro [facility] Pager [phone]

## 2014-03-02 DIAGNOSIS — I10 Essential (primary) hypertension: Secondary | ICD-10-CM

## 2014-03-02 DIAGNOSIS — D509 Iron deficiency anemia, unspecified: Secondary | ICD-10-CM | POA: Diagnosis present

## 2014-03-02 LAB — BASIC METABOLIC PANEL
Anion gap: 5 (ref 5–15)
BUN: 9 mg/dL (ref 6–23)
CALCIUM: 8.6 mg/dL (ref 8.4–10.5)
CO2: 24 mmol/L (ref 19–32)
CREATININE: 0.63 mg/dL (ref 0.50–1.10)
Chloride: 108 mmol/L (ref 96–112)
Glucose, Bld: 92 mg/dL (ref 70–99)
Potassium: 3.7 mmol/L (ref 3.5–5.1)
Sodium: 137 mmol/L (ref 135–145)

## 2014-03-02 LAB — IRON AND TIBC
Iron: 17 ug/dL — ABNORMAL LOW (ref 42–145)
SATURATION RATIOS: 3 % — AB (ref 20–55)
TIBC: 513 ug/dL — ABNORMAL HIGH (ref 250–470)
UIBC: 496 ug/dL — ABNORMAL HIGH (ref 125–400)

## 2014-03-02 LAB — CBC
HEMATOCRIT: 29.7 % — AB (ref 36.0–46.0)
HEMOGLOBIN: 8.6 g/dL — AB (ref 12.0–15.0)
MCH: 19.9 pg — ABNORMAL LOW (ref 26.0–34.0)
MCHC: 29 g/dL — AB (ref 30.0–36.0)
MCV: 68.8 fL — ABNORMAL LOW (ref 78.0–100.0)
Platelets: 360 10*3/uL (ref 150–400)
RBC: 4.32 MIL/uL (ref 3.87–5.11)
RDW: 25.5 % — ABNORMAL HIGH (ref 11.5–15.5)
WBC: 6.4 10*3/uL (ref 4.0–10.5)

## 2014-03-02 LAB — FERRITIN: Ferritin: 2 ng/mL — ABNORMAL LOW (ref 10–291)

## 2014-03-02 LAB — FOLATE: Folate: 9.4 ng/mL

## 2014-03-02 LAB — VITAMIN B12: Vitamin B-12: 299 pg/mL (ref 211–911)

## 2014-03-02 MED ORDER — CLOTRIMAZOLE 2 % VA CREA: 1.0000 | TOPICAL_CREAM | Freq: Every day | VAGINAL | Status: DC

## 2014-03-02 MED ORDER — CLOTRIMAZOLE 1 % VA CREA
1.0000 | TOPICAL_CREAM | Freq: Every day | VAGINAL | Status: DC
  Filled 2014-03-02: qty 45

## 2014-03-02 MED ORDER — FLUCONAZOLE 150 MG PO TABS
150.0000 mg | ORAL_TABLET | Freq: Once | ORAL | Status: AC
  Administered 2014-03-02: 150 mg via ORAL
  Filled 2014-03-02: qty 1

## 2014-03-02 MED ORDER — FERROUS SULFATE 325 (65 FE) MG PO TABS: 325.0000 mg | ORAL_TABLET | Freq: Three times a day (TID) | ORAL | Status: DC

## 2014-03-02 MED ORDER — CLOTRIMAZOLE 1 % VA CREA: 1.0000 | TOPICAL_CREAM | Freq: Every day | VAGINAL | Status: DC

## 2014-03-02 MED ORDER — SODIUM CHLORIDE 0.9 % IV SOLN
125.0000 mg | Freq: Once | INTRAVENOUS | Status: AC
  Administered 2014-03-02: 125 mg via INTRAVENOUS
  Filled 2014-03-02: qty 10

## 2014-03-02 NOTE — Progress Notes (Signed)
Discharge instructions given to pt, verbalized understanding. Left the unit in stable condition. 

## 2014-03-02 NOTE — Progress Notes (Signed)
MEDICATION RELATED CONSULT NOTE - INITIAL   Pharmacy Consult for IV Iron Indication: Iron-deficiency anemia  No Known Allergies  Patient Measurements: Height: 5\' 8"  (172.7 cm) (Simultaneous filing. User may not have seen previous data.) Weight: 235 lb 8 oz (106.822 kg) IBW/kg (Calculated) : 63.9   Vital Signs: Temp: 98.4 F (36.9 C) (02/24 0519) Temp Source: Oral (02/24 0519) BP: 155/84 mmHg (02/24 0519) Pulse Rate: 62 (02/24 0519) Intake/Output from previous day: 02/23 0701 - 02/24 0700 In: 832 [P.O.:120; Blood:712] Out: 300 [Urine:300] Intake/Output from this shift:    Labs:  Recent Labs  03/01/14 1443 03/01/14 1805 03/02/14 0525  WBC 7.3  --  6.4  HGB 6.8* 6.8* 8.6*  HCT 24.4* 24.3* 29.7*  PLT 374  --  360  CREATININE 0.67  --  0.63   Estimated Creatinine Clearance: 119.7 mL/min (by C-G formula based on Cr of 0.63).  Serum Iron: 17  L Ferritin:          2  L TIBC:          513 H   Microbiology: No results found for this or any previous visit (from the past 720 hour(s)).  Medical History: Past Medical History  Diagnosis Date  . Hypertension   . Thyromegaly   . Chest pain   . Pharyngitis     Medications:  Scheduled:  . sodium chloride   Intravenous Once  . ferrous sulfate  325 mg Oral TID WC  . potassium chloride  40 mEq Oral Once  . sodium chloride  3 mL Intravenous Q12H   Infusions:   PRN: sodium chloride, acetaminophen **OR** acetaminophen, sodium chloride  Assessment: 39 y/o F with history of heavy menstrual periods, iron deficiency anemia, and HTN, presented to ED 03/01/14 with ShOB on activity.  Was reportedly not taking iron supplementation prior to admission.  Hgb was 6.8.  2 units PRBC were transfused.  Goal of Therapy:  Iron replacement  Recommendation:  1. Ferrlecit 125 mg IV x 1 today.  If inpatient stay is prolonged could repeat q48h up to maximum of 8 doses. 2. Continue oral ferrous sulfate as ordered by MD. 3. Post-discharge  if oral iron not tolerated or response not adequate, consider further IV iron replacement as outpatient.  Elie Goodyandy Jailen Lung, PharmD, BCPS Pager: [phone] 03/02/2014  7:57 AM

## 2014-03-02 NOTE — Progress Notes (Signed)
CARE MANAGEMENT NOTE 03/02/2014  Patient:  Shon HaleHOOKER,Rosalynd R   Account Number:  [id number]  Date Initiated:  03/02/2014  Documentation initiated by:  Ferdinand CavaSCHETTINO,Calhoun Reichardt  Subjective/Objective Assessment:   39 yo female admitted for anemia from home     Action/Plan:   discharge planning   Anticipated DC Date:  03/02/2014   Anticipated DC Plan:  HOME/SELF CARE      DC Planning Services  CM consult  PCP issues      Choice offered to / List presented to:             Status of service:  Completed, signed off Medicare Important Message given?   (If response is "NO", the following Medicare IM given date fields will be blank) Date Medicare IM given:   Medicare IM given by:   Date Additional Medicare IM given:   Additional Medicare IM given by:    Discharge Disposition:  HOME/SELF CARE  Per UR Regulation:    If discussed at Long Length of Stay Meetings, dates discussed:    Comments:  03/02/14 Ferdinand CavaAndrea Schettino RN BSN CM [phone] [id number] Patient confirmed no PCP. [facility] at 9:00 am. Patient verbalizes understanding of appointment and a pamphlet was [facility] for [facility] with directions and contact information.

## 2014-03-02 NOTE — Discharge Summary (Signed)
Physician Discharge Summary  Stacie Gibson ZOX:[id number] DOB: 1975-11-17 DOA: 03/01/2014  PCP: No PCP Per Patient  Admit date: 03/01/2014 Discharge date: 03/02/2014  Time spent: 60 minutes  Recommendations for Outpatient Follow-up:  1. Follow-up with PCP one week. On follow-up and she'll need a CBC done to follow-up on H&H. Patient patient will likely need a referral to OB/GYN for further evaluation of her menorrhagia.  Discharge Diagnoses:  Principal Problem:   Symptomatic anemia Active Problems:   Hypertension   Hypokalemia   Iron deficiency anemia   Discharge Condition: Stable and improved  Diet recommendation: Regular  Filed Weights   03/01/14 1755  Weight: 106.822 kg (235 lb 8 oz)    History of present illness:  Stacie Gibson is a 39 y.o. female  With history of iron deficiency anemia and hypertension. Presented to the ED complaining of shortness of breath with activity most noticeable within the last 3 days. She reported that at times she has heavy menstrual periods. She was currently not on any iron supplementation and was treated last year with iron for her anemia. Other than shortness of breath with activity patient has no other complaints and stated she is generally healthy.  While in the emergency department patient had a hemoglobin of 6.8, [facility] were consulted for further medical evaluation and recommendations.   Hospital Course:  #1 symptomatic anemia Patient was admitted with shortness of breath on activity 3 days prior to admission. On admission hemoglobin was 6.8. FOBT which was done was negative. It was felt patient had a symptomatic anemia secondary to iron deficiency anemia secondary to menorrhagia. Patient was transfused 2 units of packed red blood cells with appropriate response and significant clinical improvement. Patient's hemoglobin on day of discharge was 8.6. Patient will be discharged in stable and improved condition and will follow-up  with PCP as outpatient.  #2 severe iron deficiency anemia Likely secondary to menorrhagia. FOBT was negative. Patient was supposed to be on iron tablets however was not taking them. Anemia panel which was obtained had a high level of 17 and a ferritin of 2. Patient was transfused 2 units packed red blood cells with hemoglobin coming up to 8.6 from 6.8 on admission. Patient was also given IV iron. Patient be discharged home on oral iron supplementation and is to follow-up with PCP in GYN as outpatient.  #3 hypertension Remained stable throughout the hospitalization. Patient will be resumed on her home anti-hypertensive regimen on discharge.  #4 hypokalemia Repleted.  Procedures:  Chest x-ray 03/01/2014  2 units packed red blood cells 03/01/2014  Consultations:  None  Discharge Exam: Filed Vitals:   03/02/14 1023  BP: 133/76  Pulse: 78  Temp: 99.3 F (37.4 C)  Resp: 18    General: NAD Cardiovascular: RRR Respiratory: CTAB  Discharge Instructions   Discharge Instructions    Diet general    Complete by:  As directed      Discharge instructions    Complete by:  As directed   Follow up with PCP in 1 week.     Increase activity slowly    Complete by:  As directed           Current Discharge Medication List    START taking these medications   Details  clotrimazole (GYNE-LOTRIMIN) 1 % vaginal cream Place 1 Applicatorful vaginally at bedtime. Use for 7 days then stop. Qty: 45 g, Refills: 0      CONTINUE these medications which have CHANGED   Details  ferrous sulfate 325 (65 FE) MG tablet Take 1 tablet (325 mg total) by mouth 3 (three) times daily with meals. Qty: 30 tablet, Refills: 0      CONTINUE these medications which have NOT CHANGED   Details  ibuprofen (ADVIL,MOTRIN) 200 MG tablet Take 200 mg by mouth every 6 (six) hours as needed.      docusate sodium (COLACE) 100 MG capsule Take 1 capsule (100 mg total) by mouth every 12 (twelve) hours. Qty: 60  capsule, Refills: 0    lisinopril-hydrochlorothiazide (PRINZIDE) 10-12.5 MG per tablet Take 1 tablet by mouth daily. Qty: 30 tablet, Refills: 1       No Known Allergies Follow-up Information    Schedule an appointment as soon as possible for a visit in 1 week to follow up.   Why:  f/u with PCP in 1 week.      Follow up with [facility] On 03/04/2014.   Why:  9:00 am appointment, arrive at 8:45 and bring hospital paperwork   Contact information:   [address] [facility] [place] [id number] [phone]       The results of significant diagnostics from this hospitalization (including imaging, microbiology, ancillary and laboratory) are listed below for reference.    Significant Diagnostic Studies: Dg Chest 2 View  03/01/2014   CLINICAL DATA:  Shortness of breath, rash for 3 days  EXAM: CHEST  2 VIEW  COMPARISON:  08/21/2012  FINDINGS: Cardiomediastinal silhouette is stable. No acute infiltrate or pleural effusion. No pulmonary edema. Mild degenerative changes thoracic spine.  IMPRESSION: No active cardiopulmonary disease.   Electronically Signed   By: Natasha MeadLiviu  Pop M.D.   On: 03/01/2014 15:43    Microbiology: No results found for this or any previous visit (from the past 240 hour(s)).   Labs: Basic Metabolic Panel:  Recent Labs Lab 03/01/14 1443 03/02/14 0525  NA 139 137  K 3.3* 3.7  CL 107 108  CO2 25 24  GLUCOSE 95 92  BUN 9 9  CREATININE 0.67 0.63  CALCIUM 9.2 8.6   Liver Function Tests: No results for input(s): AST, ALT, ALKPHOS, BILITOT, PROT, ALBUMIN in the last 168 hours. No results for input(s): LIPASE, AMYLASE in the last 168 hours. No results for input(s): AMMONIA in the last 168 hours. CBC:  Recent Labs Lab 03/01/14 1443 03/01/14 1805 03/02/14 0525  WBC 7.3  --  6.4  HGB 6.8* 6.8* 8.6*  HCT 24.4* 24.3* 29.7*  MCV 63.0*  --  68.8*  PLT 374  --  360   Cardiac Enzymes: No results for input(s): CKTOTAL,  CKMB, CKMBINDEX, TROPONINI in the last 168 hours. BNP: BNP (last 3 results) No results for input(s): BNP in the last 8760 hours.  ProBNP (last 3 results) No results for input(s): PROBNP in the last 8760 hours.  CBG: No results for input(s): GLUCAP in the last 168 hours.     SignedRamiro Harvest:  THOMPSON,DANIEL MD [facility] 03/02/2014, 12:00 PM

## 2014-03-02 NOTE — Progress Notes (Signed)
Pt s/p 2 Units of PRBC's tolerated both transfusions well no s/sx of transfusion reaction noted.

## 2014-03-02 NOTE — Progress Notes (Signed)
UR complete 

## 2014-03-03 LAB — TYPE AND SCREEN
ABO/RH(D): O POS
Antibody Screen: NEGATIVE
Unit division: 0
Unit division: 0

## 2014-03-04 ENCOUNTER — Inpatient Hospital Stay: Payer: Self-pay

## 2014-12-09 ENCOUNTER — Emergency Department (HOSPITAL_COMMUNITY)
Admission: EM | Admit: 2014-12-09 | Discharge: 2014-12-09 | Disposition: A | Discharge status: Home / Self Care | Payer: Self-pay | Attending: Emergency Medicine | Admitting: Emergency Medicine

## 2014-12-09 ENCOUNTER — Encounter (HOSPITAL_COMMUNITY): Payer: Self-pay

## 2014-12-09 ENCOUNTER — Emergency Department (HOSPITAL_COMMUNITY): Payer: Self-pay

## 2014-12-09 DIAGNOSIS — M79632 Pain in left forearm: Secondary | ICD-10-CM | POA: Insufficient documentation

## 2014-12-09 DIAGNOSIS — M79602 Pain in left arm: Secondary | ICD-10-CM

## 2014-12-09 DIAGNOSIS — I10 Essential (primary) hypertension: Secondary | ICD-10-CM | POA: Insufficient documentation

## 2014-12-09 DIAGNOSIS — Z8709 Personal history of other diseases of the respiratory system: Secondary | ICD-10-CM | POA: Insufficient documentation

## 2014-12-09 DIAGNOSIS — R0600 Dyspnea, unspecified: Secondary | ICD-10-CM | POA: Insufficient documentation

## 2014-12-09 DIAGNOSIS — R079 Chest pain, unspecified: Secondary | ICD-10-CM | POA: Insufficient documentation

## 2014-12-09 DIAGNOSIS — Z8639 Personal history of other endocrine, nutritional and metabolic disease: Secondary | ICD-10-CM | POA: Insufficient documentation

## 2014-12-09 LAB — BASIC METABOLIC PANEL
ANION GAP: 5 (ref 5–15)
BUN: 12 mg/dL (ref 6–20)
CALCIUM: 8.9 mg/dL (ref 8.9–10.3)
CO2: 26 mmol/L (ref 22–32)
CREATININE: 0.69 mg/dL (ref 0.44–1.00)
Chloride: 108 mmol/L (ref 101–111)
Glucose, Bld: 91 mg/dL (ref 65–99)
Potassium: 3.7 mmol/L (ref 3.5–5.1)
Sodium: 139 mmol/L (ref 135–145)

## 2014-12-09 LAB — I-STAT TROPONIN, ED: TROPONIN I, POC: 0 ng/mL (ref 0.00–0.08)

## 2014-12-09 LAB — CBC
HCT: 27.8 % — ABNORMAL LOW (ref 36.0–46.0)
HEMOGLOBIN: 8.2 g/dL — AB (ref 12.0–15.0)
MCH: 19.8 pg — ABNORMAL LOW (ref 26.0–34.0)
MCHC: 29.5 g/dL — ABNORMAL LOW (ref 30.0–36.0)
MCV: 67 fL — ABNORMAL LOW (ref 78.0–100.0)
Platelets: 324 10*3/uL (ref 150–400)
RBC: 4.15 MIL/uL (ref 3.87–5.11)
RDW: 18.1 % — ABNORMAL HIGH (ref 11.5–15.5)
WBC: 5.3 10*3/uL (ref 4.0–10.5)

## 2014-12-09 MED ORDER — IBUPROFEN 800 MG PO TABS
800.0000 mg | ORAL_TABLET | Freq: Once | ORAL | Status: AC
Start: 2014-12-09 — End: 2014-12-09
  Administered 2014-12-09: 800 mg via ORAL
  Filled 2014-12-09: qty 1

## 2014-12-09 NOTE — Discharge Instructions (Signed)
Musculoskeletal Pain Musculoskeletal pain is muscle and boney aches and pains. These pains can occur in any part of the body. Your caregiver may treat you without knowing the cause of the pain. They may treat you if blood or urine tests, X-rays, and other tests were normal.  CAUSES There is often not a definite cause or reason for these pains. These pains may be caused by a type of germ (virus). The discomfort may also come from overuse. Overuse includes working out too hard when your body is not fit. Boney aches also come from weather changes. Bone is sensitive to atmospheric pressure changes. HOME CARE INSTRUCTIONS   Ask when your test results will be ready. Make sure you get your test results.  Only take over-the-counter or prescription medicines for pain, discomfort, or fever as directed by your caregiver. If you were given medications for your condition, do not drive, operate machinery or power tools, or sign legal documents for 24 hours. Do not drink alcohol. Do not take sleeping pills or other medications that may interfere with treatment.  Continue all activities unless the activities cause more pain. When the pain lessens, slowly resume normal activities. Gradually increase the intensity and duration of the activities or exercise.  During periods of severe pain, bed rest may be helpful. Lay or sit in any position that is comfortable.  Putting ice on the injured area.  Put ice in a bag.  Place a towel between your skin and the bag.  Leave the ice on for 15 to 20 minutes, 3 to 4 times a day.  Follow up with your caregiver for continued problems and no reason can be found for the pain. If the pain becomes worse or does not go away, it may be necessary to repeat tests or do additional testing. Your caregiver may need to look further for a possible cause. SEEK IMMEDIATE MEDICAL CARE IF:  You have pain that is getting worse and is not relieved by medications.  You develop chest pain  that is associated with shortness or breath, sweating, feeling sick to your stomach (nauseous), or throw up (vomit).  Your pain becomes localized to the abdomen.  You develop any new symptoms that seem different or that concern you. MAKE SURE YOU:   Understand these instructions.  Will watch your condition.  Will get help right away if you are not doing well or get worse.   This information is not intended to replace advice given to you by your health care provider. Make sure you discuss any questions you have with your health care provider.   Document Released: 12/24/2004 Document Revised: 03/18/2011 Document Reviewed: 08/28/2012 Elsevier Interactive Patient Education 2016 Elsevier Inc. Shortness of Breath Shortness of breath means you have trouble breathing. It could also mean that you have a medical problem. You should get immediate medical care for shortness of breath. CAUSES   Not enough oxygen in the air such as with high altitudes or a smoke-filled room.  Certain lung diseases, infections, or problems.  Heart disease or conditions, such as angina or heart failure.  Low red blood cells (anemia).  Poor physical fitness, which can cause shortness of breath when you exercise.  Chest or back injuries or stiffness.  Being overweight.  Smoking.  Anxiety, which can make you feel like you are not getting enough air. DIAGNOSIS  Serious medical problems can often be found during your physical exam. Tests may also be done to determine why you are having shortness of  breath. Tests may include:  Chest X-rays.  Lung function tests.  Blood tests.  An electrocardiogram (ECG).  An ambulatory electrocardiogram. An ambulatory ECG records your heartbeat patterns over a 24-hour period.  Exercise testing.  A transthoracic echocardiogram (TTE). During echocardiography, sound waves are used to evaluate how blood flows through your heart.  A transesophageal echocardiogram  (TEE).  Imaging scans. Your health care provider may not be able to find a cause for your shortness of breath after your exam. In this case, it is important to have a follow-up exam with your health care provider as directed.  TREATMENT  Treatment for shortness of breath depends on the cause of your symptoms and can vary greatly. HOME CARE INSTRUCTIONS   Do not smoke. Smoking is a common cause of shortness of breath. If you smoke, ask for help to quit.  Avoid being around chemicals or things that may bother your breathing, such as paint fumes and dust.  Rest as needed. Slowly resume your usual activities.  If medicines were prescribed, take them as directed for the full length of time directed. This includes oxygen and any inhaled medicines.  Keep all follow-up appointments as directed by your health care provider. SEEK MEDICAL CARE IF:   Your condition does not improve in the time expected.  You have a hard time doing your normal activities even with rest.  You have any new symptoms. SEEK IMMEDIATE MEDICAL CARE IF:   Your shortness of breath gets worse.  You feel light-headed, faint, or develop a cough not controlled with medicines.  You start coughing up blood.  You have pain with breathing.  You have chest pain or pain in your arms, shoulders, or abdomen.  You have a fever.  You are unable to walk up stairs or exercise the way you normally do. MAKE SURE YOU:  Understand these instructions.  Will watch your condition.  Will get help right away if you are not doing well or get worse.   This information is not intended to replace advice given to you by your health care provider. Make sure you discuss any questions you have with your health care provider.   Document Released: 09/18/2000 Document Revised: 12/29/2012 Document Reviewed: 03/11/2011 Elsevier Interactive Patient Education 2016 [employer].  Emergency Department Resource Guide 1) Find a Doctor and Pay  Out of Pocket Although you won't have to find out who is covered by your insurance plan, it is a good idea to ask around and get recommendations. You will then need to call the office and see if the doctor you have chosen will accept you as a new patient and what types of options they offer for patients who are self-pay. Some doctors offer discounts or will set up payment plans for their patients who do not have insurance, but you will need to ask so you aren't surprised when you get to your appointment.  2) Contact Your Local Health Department Not all health departments have doctors that can see patients for sick visits, but many do, so it is worth a call to see if yours does. If you don't know where your local health department is, you can check in your phone book. The CDC also has a tool to help you locate your state's health department, and many state websites also have listings of all of their local health departments.  3) Find a Walk-in Clinic If your illness is not likely to be very severe or complicated, you may want to try  a walk in clinic. These are popping up all over the country in pharmacies, drugstores, and shopping centers. They're usually staffed by nurse practitioners or physician assistants that have been trained to treat common illnesses and complaints. They're usually fairly quick and inexpensive. However, if you have serious medical issues or chronic medical problems, these are probably not your best option.  No Primary Care Doctor: - Call Health Connect at  [phone] - they can help you locate a primary care doctor that  accepts your insurance, provides certain services, etc. - Physician Referral Service- [phone]  Chronic Pain Problems: Organization         Address  Phone   Notes  [facility]  [phone] Patients need to be referred by their primary care doctor.   Medication Assistance: Organization         Address  Phone   Notes  [facility]  Medication [facility] [address] [place] [place], [place] [address] [phone] --Must be a resident of [facility] -- Must have NO insurance coverage whatsoever (no Medicaid/ Medicare, etc.) -- The pt. MUST have a primary care doctor that directs their care regularly and follows them in the community   [facility]  [phone]   [employer]  [phone]    Agencies that provide inexpensive medical care: Organization         Address  Phone   Notes  [facility]  [phone]   [facility]    [phone]   [facility] [address] [place], [place] [address] [phone]   [facility] [address], [place] [phone]   Planned Parenthood    [phone]   [facility]    [phone]   [facility]  [address], [place] Phone:  [phone], Fax:  [phone] Hours of Operation:  9 am - 6 pm, M-F.  Also accepts Medicaid/Medicare and self-pay.  [facility]  [address], [place] Phone: [phone], Fax: [phone]. Hours of Operation:  8:30 am - 5:30 pm, M-F.  Also accepts Medicaid and self-pay.  [facility] [place] [address], [place] Phone: [phone]   [facility] [address] Natasha Bence [place], [place] [phone], Ext. [phone] Mondays & Thursdays: 7-9 AM.  First 15 patients are seen on a first come, first serve basis.    Medicaid-accepting [facility] Providers:  Organization         Address  Phone   Notes  [facility] [address],  [phone] Also accepts self-pay patients.  [facility] [address] Laurell Josephs [place], [place]  [phone]   [facility] [address], [place] [phone]   [facility] [address], [place] [phone]   Renaye Rakers [address], [place]   [phone] Only accepts [place] patients after they have their name applied to their card.   Self-Pay (no insurance) in [facility]:  Organization         Address  Phone   Notes  Sickle Cell Patients, [facility] [address] [place], [place] [phone]   [facility] [address] [place], [place] [phone]  [facility] [place]  [address], [place] [phone]   [facility]/Dr. Osei-Bonsu  [address] [place] or [address], [place] [phone] Phone number for both [place] and [place] locations is the same.  [facility] [address] [place] [phone]   [facility] [address] [place] or [address] [phone]   [facility] [address], [place] [phone], phone; [phone], fax Sees patients 1st and 3rd Saturday of every month.  Must not qualify for public or private insurance (i.e. Medicaid, Medicare, [place] Health Choice, Veterans' Benefits)  Household income should be no more than 200% of the poverty level The clinic cannot treat you if you are pregnant or think you are pregnant  Sexually transmitted diseases are not treated at the clinic.    Dental Care: Organization         Address  Phone  Notes  [facility] of [facility] [address] [place], [place] [phone] Accepts children up to age 82 who are enrolled in [place] or [place] Health Choice; pregnant women with a Medicaid card; and children who have applied for Medicaid or [place] Health Choice, but were declined, whose parents can pay a reduced fee at time of service.  [facility] of [facility]  [address], [place]  [phone] Accepts children up to age 74 who are enrolled in [place] or [place] Health Choice; pregnant women with a Medicaid card; and children who have applied for Medicaid or [place] Health Choice, but were declined, whose parents can pay a reduced fee at time of service.  [facility]  [address] [place], [place] [phone] Patients are seen by appointment only. Walk-ins are not accepted. [facility] will see patients 76 years of age and older. Monday - Tuesday (8am-5pm) Most Wednesdays (8:30-5pm) $30 per visit, cash only  [facility]  [address], [facility] [phone] Patients are seen by appointment only. Walk-ins are not accepted. [facility] will see patients 86 years of age and older. One Wednesday Evening (Monthly: Volunteer Based).  $30 per visit, cash only  [employer]  [phone] for adults; Children under age 70, call Graduate Pediatric Dentistry at [phone]. Children aged 42-14, please call [phone] to request a pediatric application.  Dental services are provided in all areas of dental care including fillings, crowns and bridges, complete and partial dentures, implants, gum treatment, root canals, and extractions. Preventive care is also provided. Treatment is provided to both adults and children. Patients are selected via a lottery and there is often a waiting list.   [facility] [address] [place]  [phone] [email or web address]   [facility] [address] [place], [place] [phone], Ext. [phone] Second and Fourth Thursday of each month, opens at 6:30 AM; Clinic ends at 9 AM.  Patients are seen on a first-come first-served basis, and a limited number are seen during each clinic.   [facility]  [address] Ether Griffins [place], [place] [phone]   Eligibility Requirements You must have lived in [place], [place], or [place]  counties for at least the last three months.   You cannot be eligible for state or federal sponsored  healthcare insurance, including CIGNAVeterans Administration, [place]icaid, or [employer]icare.   You generally cannot be eligible for healthcare insurance through your employer.    How to apply: Eligibility screenings are held every Tuesday and Wednesday afternoon from 1:00 pm until 4:00 pm. You do not need an appointment for the interview!  [facility] [address], [place], [place] [phone]   [facility]  [phone]   [facility]  [phone]   [facility]  [phone]    Behavioral Health Resources in the Community: Intensive Outpatient Programs Organization         Address  Phone  Notes  [facility] [address], [place], [place] [phone]   [facility] Outpatient [address], [place], [place] [phone]   [facility] [address], [place], [place]  [phone]   [facility] [address],  [place], [place] [phone] or [phone]   Substance Abuse Resources Organization         Address  Phone  Notes  [facility]  [phone]   [facility]  [phone]   [facility]  [phone]   [facility]  [phone]   Residential & Outpatient Substance Abuse Program  [phone]   Psychological Services Organization         Address  Phone  Notes  [facility]  [phone]- [phone]   [facility]  [phone]- [phone]   [facility] [address], [place] [phone] or [phone]    Mobile Crisis Teams Organization         Address  Phone  Notes  [facility], Mobile Crisis Care Unit  [phone]   [facility]  [address] [place], [place] [phone]   Doristine LocksSharon DeEsch [address] [place]  [place] [phone]    Self-Help/Support Groups Organization         Address  Phone             Notes  [facility] [place] - variety of support groups  336- [phone] Call for more information  Narcotics Anonymous (NA), [facility] [address], [place]  2 meetings at this location   Statisticianesidential Treatment Programs Organization         Address  Phone  Notes  [facility] [address],    [place]  [phone]   [facility]  [address], [place] [phone], [place], [place] [phone]   [facility] [address], [place] [phone] Admissions: 8am-3pm M-F  [facility] [address]    [place], [place] [phone]   [facility] [address], [place], [place] [phone]   [facility] [address]  [place], [place] [phone]   [facility] - Intensive Outpatient [address], [place], [place] [phone]   [facility] ([facility] [address]  [place], [place] [phone] or [phone]   [facility] ([facility]) [address] [place], [place] [phone] Accepts Medicaid  [facility] [address]  [place] [phone] Substance Abuse/Addiction Treatment   [facility] Resources Organization         Address  Phone  Notes  [facility]  [phone]   Angie FavaJulie Brannon, PhD [address]yn Skeeters [place], [place]   [phone] or [phone]   [facility]   [address] [place], [place] [phone]   [facility] [address], [place], [place] [phone] Insurance/Medicaid/sponsorship through [facility] [address]                                    [place], [place] [phone] Therapy/tele-psych/case  [facility] [address]   [place], [place] [phone]    Dr. Lolly Mustache  [phone]   [facility]  [employer] [facility] Dept. 1) [address], [place] 2) [address] [place] 3)  [address], [place] [phone]  [phone]   [facility] Child Abuse Hotline 450-564-0635 or 2346053314 (After Hours)

## 2014-12-09 NOTE — ED Notes (Signed)
Patient transported to X-ray 

## 2014-12-09 NOTE — ED Notes (Signed)
Pt states chest pain with left arm pain.  Pt states slight cough but nothing heavy.  Pt denies lifting heavy object.  Pt almost fell yesterday when turning around suddenly.  Some shortness of breath.

## 2014-12-09 NOTE — ED Notes (Signed)
MD at bedside. 

## 2014-12-09 NOTE — ED Provider Notes (Signed)
CSN: [id number]     Arrival date & time 12/09/14  0755 History   First MD Initiated Contact with Patient 12/09/14 [phone]     Chief Complaint  Patient presents with  . Chest Pain  . Arm Pain     (Consider location/radiation/quality/duration/timing/severity/associated sxs/prior Treatment) HPI Patient poor she's having pain in her left arm. She indicates that the medial and volar aspect of her forearm. From this area she feels that pain radiates somewhat more up toward her shoulder. She reports it hurts to move her hand in certain positions. Patient does work doing a lot of wrist motion and lifting. She had reported shortness of breath in the triage note. She clarifies however for months or more she notes that she gets somewhat more short of breath with walking and activity. She is not experiencing chest pain with this. No lower extremity swelling or calf pain. Past Medical History  Diagnosis Date  . Hypertension   . Thyromegaly   . Chest pain   . Pharyngitis    History reviewed. No pertinent past surgical history. Family History  Problem Relation Age of Onset  . Hypertension Mother    Social History  Substance Use Topics  . Smoking status: Never Smoker   . Smokeless tobacco: Never Used  . Alcohol Use: No   OB History    No data available     Review of Systems  10 Systems reviewed and are negative for acute change except as noted in the HPI.   Allergies  Review of patient's allergies indicates no known allergies.  Home Medications   Prior to Admission medications   Medication Sig Start Date End Date Taking? Authorizing Provider  acetaminophen (TYLENOL) 500 MG tablet Take 500 mg by mouth every 6 (six) hours as needed for moderate pain or headache.   Yes Historical Provider, MD  ibuprofen (ADVIL,MOTRIN) 200 MG tablet Take 400 mg by mouth every 6 (six) hours as needed for headache or moderate pain.    Yes Historical Provider, MD  ferrous sulfate 325 (65 FE) MG tablet Take 1  tablet (325 mg total) by mouth 3 (three) times daily with meals. Patient not taking: Reported on 12/09/2014 03/02/14   Rodolph Bong, MD  lisinopril-hydrochlorothiazide (PRINZIDE) 10-12.5 MG per tablet Take 1 tablet by mouth daily. Patient not taking: Reported on 03/01/2014 08/21/12   Tatyana Kirichenko, PA-C   BP 119/88 mmHg  Pulse 53  Temp(Src) 98.6 F (37 C) (Oral)  Resp 16  Ht  (1.727 m)  SpO2 100%  LMP 11/15/2014 Physical Exam  Constitutional: She is oriented to person, place, and time. She appears well-developed and well-nourished.  HENT:  Head: Normocephalic and atraumatic.  Eyes: EOM are normal. Pupils are equal, round, and reactive to light.  Neck: Neck supple.  Cardiovascular: Normal rate, regular rhythm, normal heart sounds and intact distal pulses.   Pulmonary/Chest: Effort normal and breath sounds normal.  Abdominal: Soft. Bowel sounds are normal. She exhibits no distension. There is no tenderness.  Musculoskeletal: Normal range of motion. She exhibits tenderness. She exhibits no edema.  Patient has focal tenderness to the volar and medial aspect of the left forearm. There is no palpable mass or erythema. Pain is exacerbated by flexion at the wrist. There is no pain or tenderness in the [facility] fossa. No tenderness to the upper arm or the axilla. Lower extremity is her normal without calf tenderness or peripheral edema.  Neurological: She is alert and oriented to person, place, and time.  She has normal strength. Coordination normal. GCS eye subscore is 4. GCS verbal subscore is 5. GCS motor subscore is 6.  Skin: Skin is warm, dry and intact.  Psychiatric: She has a normal mood and affect.    ED Course  Procedures (including critical care time) Labs Review Labs Reviewed  CBC - Abnormal; Notable for the following:    Hemoglobin 8.2 (*)    HCT 27.8 (*)    MCV 67.0 (*)    MCH 19.8 (*)    MCHC 29.5 (*)    RDW 18.1 (*)    All other components within normal limits   BASIC METABOLIC PANEL  I-STAT TROPOININ, ED    Imaging Review Dg Chest 2 View  12/09/2014  CLINICAL DATA:  Chest pain and cough for 3 days. EXAM: CHEST  2 VIEW COMPARISON:  March 01, 2014. FINDINGS: The heart size and mediastinal contours are within normal limits. Both lungs are clear. No pneumothorax or pleural effusion is noted. The visualized skeletal structures are unremarkable. IMPRESSION: No active cardiopulmonary disease. Electronically Signed   By: Lupita RaiderJames  Green Jr, M.D.   On: 12/09/2014 08:29   I have personally reviewed and evaluated these images and lab results as part of my medical decision-making.   EKG Interpretation   Date/Time:  Friday December 09 2014 08:04:23 EST Ventricular Rate:  61 PR Interval:  168 QRS Duration: 98 QT Interval:  397 QTC Calculation: 400 R Axis:   55 Text Interpretation:  Sinus rhythm normal. no chamge Confirmed by  Donnald GarrePfeiffer, MD, Lebron ConnersMarcy [id number]) on 12/09/2014 8:35:51 AM      MDM   Final diagnoses:  Arm pain, medial, left  Dyspnea   At this time, her arm pain appears most likely to be musculoskeletal in origin. Patient does express some mild exertional dyspnea that she noted for months. There is no evidence she is in acute congestive heart failure or has likely cardiac ischemic symptoms. Patient is counseled to follow-up with her outpatient provider to continue monitoring and testing for any issues related to exertional dyspnea. Her diagnostic workup in the emergency department is within normal limits. At this time do feel she is safe to take ibuprofen for musculoskeletal forearm pain with instructions to return for any changing or worsening symptoms.    Arby BarretteMarcy Meric Joye, MD 12/09/14 1136

## 2016-12-05 IMAGING — CR DG CHEST 2V
2 series · 2 of 2 positions shown · non-contrast
Comparison: 08/21/2012

CLINICAL DATA: Shortness of breath, rash for 3 days

EXAM:
CHEST  2 VIEW

[w chest pa]
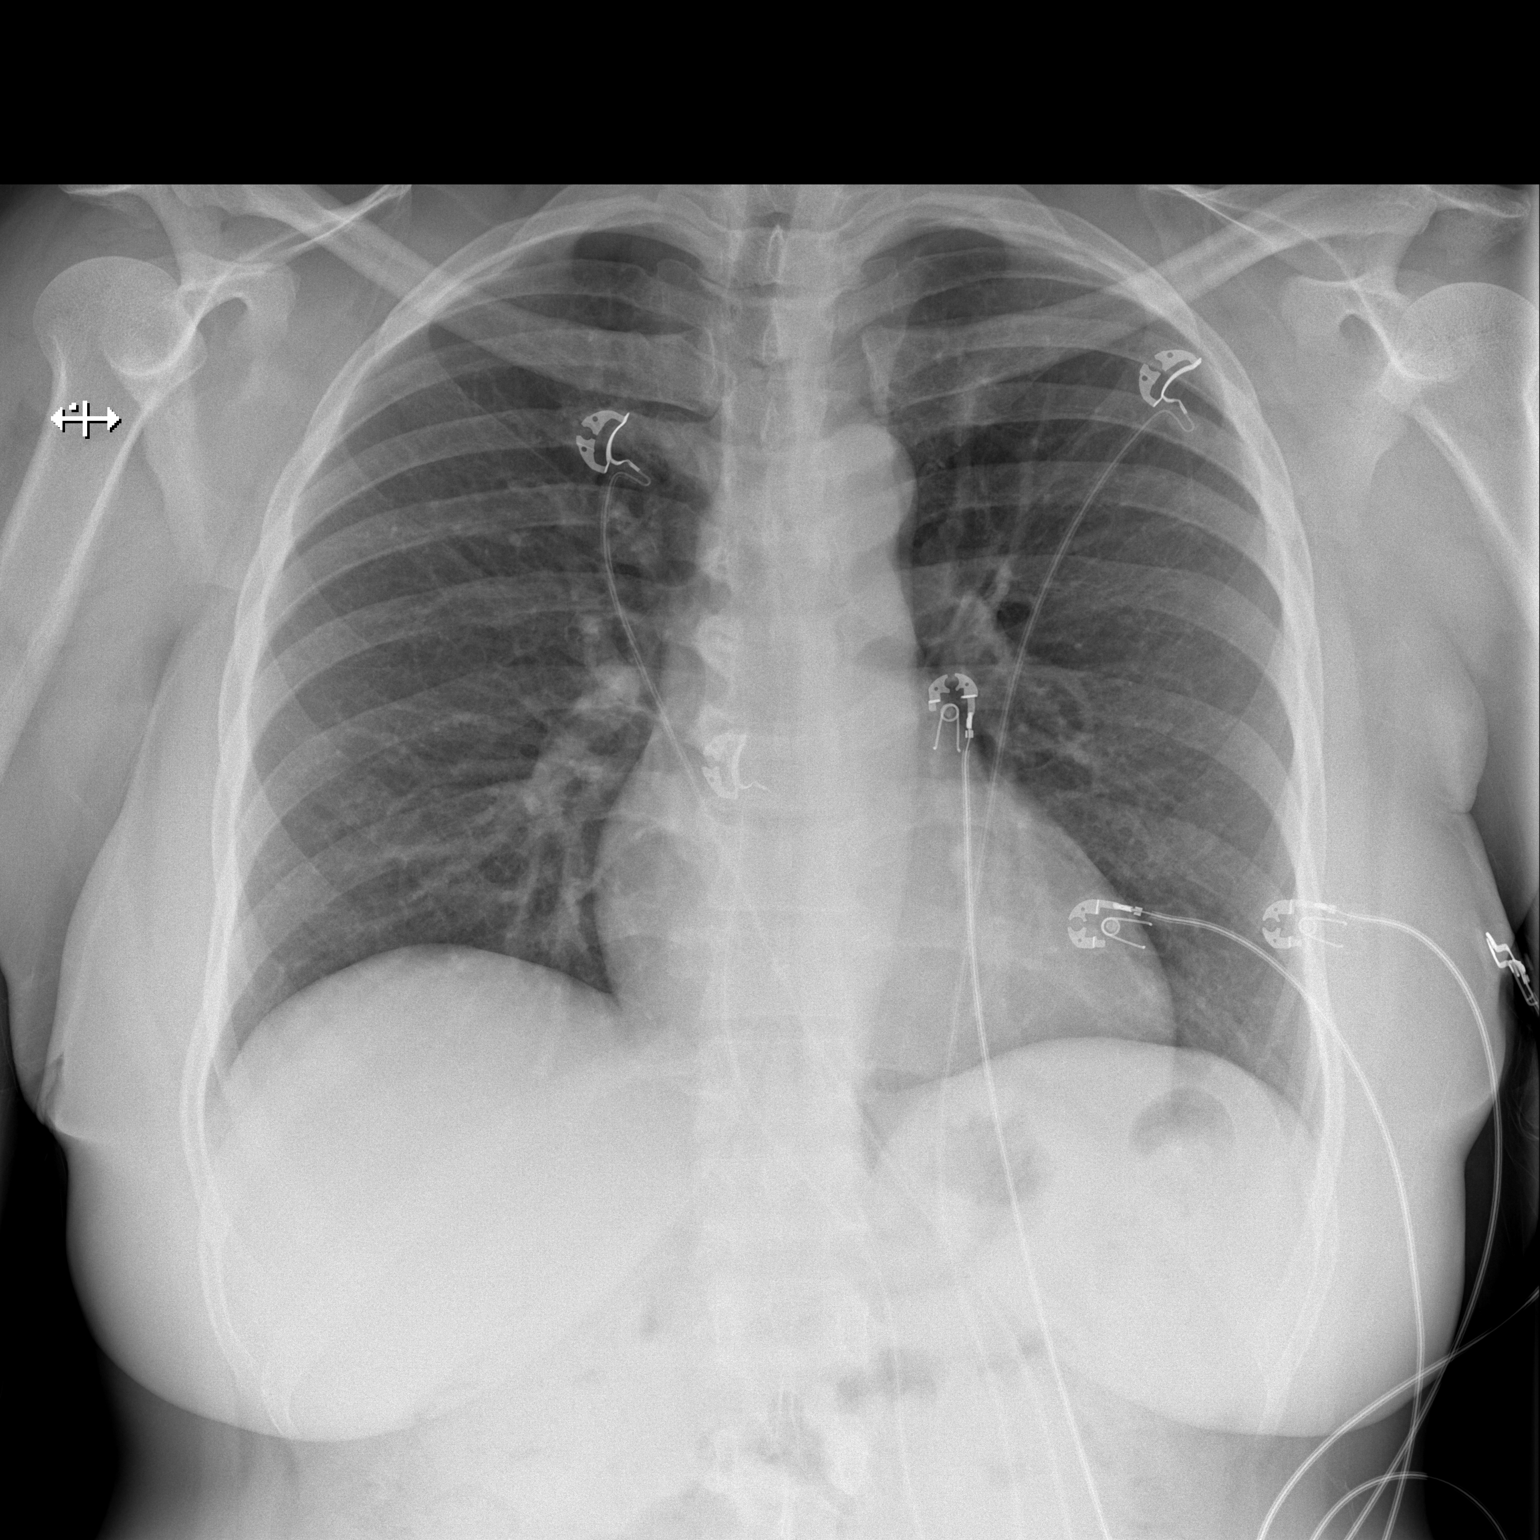

[w chest lat]
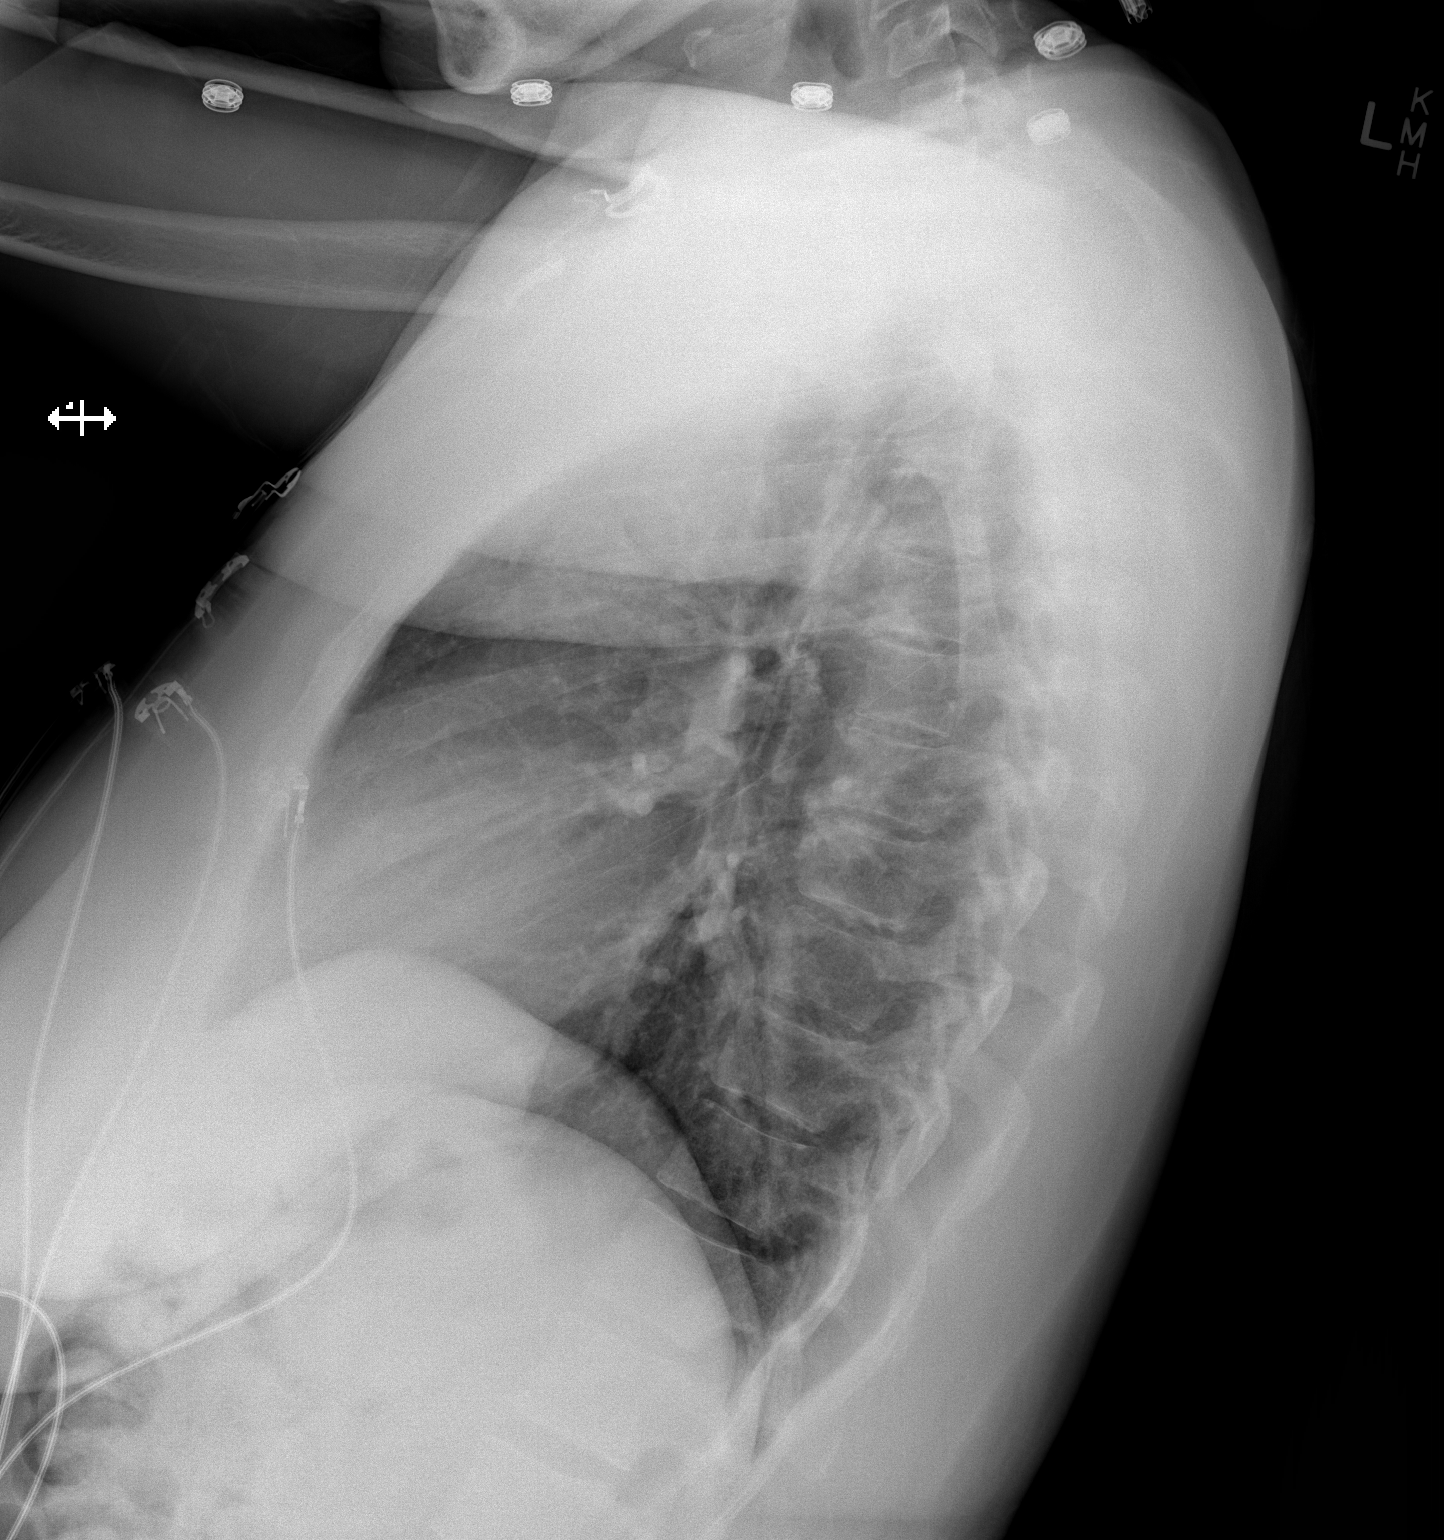

[2 of 2 positions shown; findings below may reference images not displayed]

FINDINGS: Cardiomediastinal silhouette is stable. No acute infiltrate or
pleural effusion. No pulmonary edema. Mild degenerative changes
thoracic spine.
IMPRESSION: No active cardiopulmonary disease.

## 2019-09-23 ENCOUNTER — Other Ambulatory Visit: Payer: Self-pay

## 2019-09-23 ENCOUNTER — Ambulatory Visit (HOSPITAL_COMMUNITY)
Admission: EM | Admit: 2019-09-23 | Discharge: 2019-09-23 | Disposition: A | Discharge status: Home / Self Care | Payer: Self-pay | Attending: Family Medicine | Admitting: Family Medicine

## 2019-09-23 ENCOUNTER — Telehealth (HOSPITAL_COMMUNITY): Payer: Self-pay | Admitting: Family Medicine

## 2019-09-23 ENCOUNTER — Encounter (HOSPITAL_COMMUNITY): Payer: Self-pay | Admitting: *Deleted

## 2019-09-23 DIAGNOSIS — N898 Other specified noninflammatory disorders of vagina: Secondary | ICD-10-CM | POA: Insufficient documentation

## 2019-09-23 HISTORY — DX: Post-traumatic stress disorder, unspecified: F43.10

## 2019-09-23 HISTORY — DX: Anxiety disorder, unspecified: F41.9

## 2019-09-23 LAB — CBC WITH DIFFERENTIAL/PLATELET
Abs Immature Granulocytes: 0.02 10*3/uL (ref 0.00–0.07)
Basophils Absolute: 0.1 10*3/uL (ref 0.0–0.1)
Basophils Relative: 1 %
Eosinophils Absolute: 0.1 10*3/uL (ref 0.0–0.5)
Eosinophils Relative: 1 %
HCT: 20 % — ABNORMAL LOW (ref 36.0–46.0)
Hemoglobin: 5.2 g/dL — CL (ref 12.0–15.0)
Immature Granulocytes: 0 %
Lymphocytes Relative: 29 %
Lymphs Abs: 2.1 10*3/uL (ref 0.7–4.0)
MCH: 16.5 pg — ABNORMAL LOW (ref 26.0–34.0)
MCHC: 26 g/dL — ABNORMAL LOW (ref 30.0–36.0)
MCV: 63.5 fL — ABNORMAL LOW (ref 80.0–100.0)
Monocytes Absolute: 0.2 10*3/uL (ref 0.1–1.0)
Monocytes Relative: 3 %
Neutro Abs: 4.6 10*3/uL (ref 1.7–7.7)
Neutrophils Relative %: 66 %
Platelets: 199 10*3/uL (ref 150–400)
RBC: 3.15 MIL/uL — ABNORMAL LOW (ref 3.87–5.11)
RDW: 29.1 % — ABNORMAL HIGH (ref 11.5–15.5)
WBC: 7.1 10*3/uL (ref 4.0–10.5)
nRBC: 0 % (ref 0.0–0.2)

## 2019-09-23 LAB — POCT URINALYSIS DIPSTICK, ED / UC
Bilirubin Urine: NEGATIVE
Glucose, UA: NEGATIVE mg/dL
Ketones, ur: NEGATIVE mg/dL
Leukocytes,Ua: NEGATIVE
Nitrite: NEGATIVE
Protein, ur: NEGATIVE mg/dL
Specific Gravity, Urine: 1.02 (ref 1.005–1.030)
Urobilinogen, UA: 0.2 mg/dL (ref 0.0–1.0)
pH: 6.5 (ref 5.0–8.0)

## 2019-09-23 LAB — HIV ANTIBODY (ROUTINE TESTING W REFLEX): HIV Screen 4th Generation wRfx: NONREACTIVE

## 2019-09-23 NOTE — ED Notes (Signed)
2nd attempt to call patient in waiting room, no answer.

## 2019-09-23 NOTE — Telephone Encounter (Signed)
Received critical CBC result on pt, Hg 5.2. Called pt and discussed this at length, strong recommendation made to go to the ED for further evaluation and supplementation as needed. Patient reluctantly agreeable to this. Declines EMS transport and will have someone drive her.

## 2019-09-23 NOTE — ED Notes (Signed)
Attempted to call patient in waiting room x 1, no answer.  

## 2019-09-23 NOTE — ED Triage Notes (Signed)
Patient states for about 2-3 days she is having yellow watery vaginal discharge and feels like she urinated on herself.Patient states when in toilet it looks after using the bathroom it looks like oil is in the toilet. Patient is unable to tell if it is urine or a bowel movement. Patient states she has not been the same since 2016-2017 after receiving a blood transfusion. Patient reports increased urination. Patient denies foul odor. Patient states she had a bowel movement today that was normal in color but noted that it looked like oil was dropped in the toilet water. Patient reports some vaginal itching x 2 days but it is not current presently. Patient states she drunk apple cider vinegar and water but has not taken any over the counter medications for symptoms. Patient would like to have a STD check.

## 2019-09-23 NOTE — ED Provider Notes (Signed)
[facility]    CSN: [id number] Arrival date & time: 09/23/19  1240      History   Chief Complaint No chief complaint on file.   HPI Stacie Gibson is a 44 y.o. female.   Patient presenting today for 2-3 days of thin watery vaginal discharge. Denies vaginal odor, rashes, exposures to STI, abdominal pain, N/V/D, urinary sxs. Has not tried anything for her sxs.      Past Medical History:  Diagnosis Date  . Anxiety   . Chest pain   . Hypertension   . Pharyngitis   . PTSD (post-traumatic stress disorder)   . Thyromegaly     Patient Active Problem List   Diagnosis Date Noted  . Iron deficiency anemia 03/02/2014  . Symptomatic anemia 03/01/2014  . Hypokalemia 03/01/2014  . Chest pain 10/02/2010  . Hypertension 10/02/2010  . Thyromegaly 10/02/2010    No past surgical history on file.  OB History   No obstetric history on file.      Home Medications    Prior to Admission medications   Medication Sig Start Date End Date Taking? Authorizing Provider  acetaminophen (TYLENOL) 500 MG tablet Take 500 mg by mouth every 6 (six) hours as needed for moderate pain or headache.    [provider]  ferrous sulfate 325 (65 FE) MG tablet Take 1 tablet (325 mg total) by mouth 3 (three) times daily with meals. Patient not taking: Reported on 12/09/2014 03/02/14   Rodolph Bong, MD  ibuprofen (ADVIL,MOTRIN) 200 MG tablet Take 400 mg by mouth every 6 (six) hours as needed for headache or moderate pain.     [provider]  lisinopril-hydrochlorothiazide (PRINZIDE) 10-12.5 MG per tablet Take 1 tablet by mouth daily. Patient not taking: Reported on 03/01/2014 08/21/12   Jaynie Crumble, PA-C    Family History Family History  Problem Relation Age of Onset  . Hypertension Mother     Social History Social History   Tobacco Use  . Smoking status: Never Smoker  . Smokeless tobacco: Never Used  Vaping Use  . Vaping Use: Never assessed    Substance Use Topics  . Alcohol use: No  . Drug use: No     Allergies   Patient has no known allergies.   Review of Systems Review of Systems PER HPI   Physical Exam Triage Vital Signs ED Triage Vitals  Enc Vitals Group     BP 09/23/19 1339 (!) 150/88     Pulse Rate 09/23/19 1339 84     Resp 09/23/19 1339 18     Temp 09/23/19 1339 99.3 F (37.4 C)     Temp Source 09/23/19 1339 Oral     SpO2 09/23/19 1339 97 %     Weight --      Height --      Head Circumference --      Peak Flow --      Pain Score 09/23/19 1340 0     Pain Loc --      Pain Edu? --      Excl. in GC? --    No data found.  Updated Vital Signs BP (!) 150/88 (BP Location: Right Arm)   Pulse 84   Temp 99.3 F (37.4 C) (Oral)   Resp 18   LMP 08/27/2019   SpO2 97%   Visual Acuity Right Eye Distance:   Left Eye Distance:   Bilateral Distance:    Right Eye Near:   Left Eye  Near:    Bilateral Near:     Physical Exam Vitals and nursing note reviewed.  Constitutional:      Appearance: Normal appearance. She is not ill-appearing.  HENT:     Head: Atraumatic.     Right Ear: Tympanic membrane normal.     Left Ear: Tympanic membrane normal.     Nose: Nose normal.     Mouth/Throat:     Mouth: Mucous membranes are moist.  Eyes:     Extraocular Movements: Extraocular movements intact.     Conjunctiva/sclera: Conjunctivae normal.  Cardiovascular:     Rate and Rhythm: Normal rate and regular rhythm.     Heart sounds: Normal heart sounds.  Pulmonary:     Effort: Pulmonary effort is normal.     Breath sounds: Normal breath sounds.  Abdominal:     General: Bowel sounds are normal. There is no distension.     Palpations: Abdomen is soft.     Tenderness: There is no abdominal tenderness. There is no right CVA tenderness, left CVA tenderness or guarding.  Musculoskeletal:        General: Normal range of motion.     Cervical back: Normal range of motion and neck supple.  Skin:    General: Skin  is warm and dry.  Neurological:     Mental Status: She is alert and oriented to person, place, and time.  Psychiatric:        Mood and Affect: Mood normal.        Thought Content: Thought content normal.        Judgment: Judgment normal.     UC Treatments / Results  Labs (all labs ordered are listed, but only abnormal results are displayed) Labs Reviewed  POCT URINALYSIS DIPSTICK, ED / UC - Abnormal; Notable for the following components:      Result Value   Hgb urine dipstick TRACE (*)    All other components within normal limits  CBC WITH DIFFERENTIAL/PLATELET  HIV ANTIBODY (ROUTINE TESTING W REFLEX)  RPR  CERVICOVAGINAL ANCILLARY ONLY    EKG   Radiology No results found.  Procedures Procedures (including critical care time)  Medications Ordered in UC Medications - No data to display  Initial Impression / Assessment and Plan / UC Course  I have reviewed the triage vital signs and the nursing notes.  Pertinent labs & imaging results that were available during my care of the patient were reviewed by me and considered in my medical decision making (see chart for details).     U/A benign, aptima swab and labs pending. CBC for reassurance given her hx of profound anemia and general feeling of something being wrong she notes having. Discussed importance of establishing with PCP for ongoing follow up and preventative healthcare.   Final Clinical Impressions(s) / UC Diagnoses   Final diagnoses:  Vaginal discharge   Discharge Instructions   None    ED Prescriptions    None     PDMP not reviewed this encounter.   Particia Nearing, [place] 09/23/19 1500

## 2019-09-24 ENCOUNTER — Telehealth (HOSPITAL_COMMUNITY): Payer: Self-pay | Admitting: Emergency Medicine

## 2019-09-24 LAB — CERVICOVAGINAL ANCILLARY ONLY
Bacterial Vaginitis (gardnerella): POSITIVE — AB
Candida Glabrata: NEGATIVE
Candida Vaginitis: NEGATIVE
Chlamydia: NEGATIVE
Comment: NEGATIVE
Comment: NEGATIVE
Comment: NEGATIVE
Comment: NEGATIVE
Comment: NEGATIVE
Comment: NORMAL
Neisseria Gonorrhea: NEGATIVE
Trichomonas: NEGATIVE

## 2019-09-24 LAB — RPR
RPR Ser Ql: REACTIVE — AB
RPR Titer: 1:1 {titer}

## 2019-09-24 MED ORDER — METRONIDAZOLE 500 MG PO TABS: 500.0000 mg | ORAL_TABLET | Freq: Two times a day (BID) | ORAL | 0 refills | Status: DC

## 2019-09-24 NOTE — Telephone Encounter (Signed)
Patient will need treatment, per protocol, with Flagyl.  Called in and reviewed with patient.  Also tried again to encourage patient to follow up in ER for evaluation due to HGB of 5.2  Patient states "I feel fine".  This RN explained again to patient the risks with a HGB this low and why her provider recommended ER follow up.  Patient verbalized understanding.  Prescription for Flagyl sent to pharmacy of request.

## 2019-09-27 LAB — T.PALLIDUM AB, TOTAL: T Pallidum Abs: NONREACTIVE

## 2019-09-28 ENCOUNTER — Observation Stay (HOSPITAL_COMMUNITY)
Admission: EM | Admit: 2019-09-28 | Discharge: 2019-09-29 | Disposition: A | Discharge status: Home / Self Care | Payer: Self-pay | Attending: Family Medicine | Admitting: Family Medicine

## 2019-09-28 ENCOUNTER — Encounter (HOSPITAL_COMMUNITY): Payer: Self-pay

## 2019-09-28 ENCOUNTER — Other Ambulatory Visit: Payer: Self-pay

## 2019-09-28 DIAGNOSIS — D500 Iron deficiency anemia secondary to blood loss (chronic): Secondary | ICD-10-CM

## 2019-09-28 DIAGNOSIS — Z20822 Contact with and (suspected) exposure to covid-19: Secondary | ICD-10-CM | POA: Insufficient documentation

## 2019-09-28 DIAGNOSIS — D649 Anemia, unspecified: Principal | ICD-10-CM | POA: Diagnosis present

## 2019-09-28 DIAGNOSIS — I10 Essential (primary) hypertension: Secondary | ICD-10-CM | POA: Diagnosis present

## 2019-09-28 DIAGNOSIS — R0602 Shortness of breath: Secondary | ICD-10-CM | POA: Insufficient documentation

## 2019-09-28 DIAGNOSIS — Z79899 Other long term (current) drug therapy: Secondary | ICD-10-CM | POA: Insufficient documentation

## 2019-09-28 DIAGNOSIS — D509 Iron deficiency anemia, unspecified: Secondary | ICD-10-CM | POA: Diagnosis present

## 2019-09-28 DIAGNOSIS — Z23 Encounter for immunization: Secondary | ICD-10-CM | POA: Insufficient documentation

## 2019-09-28 LAB — IRON AND TIBC
Iron: 11 ug/dL — ABNORMAL LOW (ref 28–170)
Saturation Ratios: 2 % — ABNORMAL LOW (ref 10.4–31.8)
TIBC: 552 ug/dL — ABNORMAL HIGH (ref 250–450)
UIBC: 541 ug/dL

## 2019-09-28 LAB — PROTIME-INR
INR: 1.2 (ref 0.8–1.2)
Prothrombin Time: 14.2 seconds (ref 11.4–15.2)

## 2019-09-28 LAB — BASIC METABOLIC PANEL
Anion gap: 13 (ref 5–15)
BUN: 11 mg/dL (ref 6–20)
CO2: 23 mmol/L (ref 22–32)
Calcium: 9.3 mg/dL (ref 8.9–10.3)
Chloride: 104 mmol/L (ref 98–111)
Creatinine, Ser: 0.68 mg/dL (ref 0.44–1.00)
GFR calc Af Amer: >60 mL/min (ref >60)
GFR calc non Af Amer: >60 mL/min (ref >60)
Glucose, Bld: 87 mg/dL (ref 70–99)
Potassium: 3.4 mmol/L — ABNORMAL LOW (ref 3.5–5.1)
Sodium: 140 mmol/L (ref 135–145)

## 2019-09-28 LAB — SARS CORONAVIRUS 2 BY RT PCR (HOSPITAL ORDER, PERFORMED IN ~~LOC~~ HOSPITAL LAB): SARS Coronavirus 2: NEGATIVE

## 2019-09-28 LAB — RETICULOCYTES
Immature Retic Fract: 15.1 % (ref 2.3–15.9)
RBC.: 2.97 MIL/uL — ABNORMAL LOW (ref 3.87–5.11)
Retic Count, Absolute: 42.5 10*3/uL (ref 19.0–186.0)
Retic Ct Pct: 1.4 % (ref 0.4–3.1)

## 2019-09-28 LAB — CBC WITH DIFFERENTIAL/PLATELET
Abs Immature Granulocytes: 0.02 10*3/uL (ref 0.00–0.07)
Basophils Absolute: 0.1 10*3/uL (ref 0.0–0.1)
Basophils Relative: 1 %
Eosinophils Absolute: 0.1 10*3/uL (ref 0.0–0.5)
Eosinophils Relative: 2 %
HCT: 19.3 % — ABNORMAL LOW (ref 36.0–46.0)
Hemoglobin: 5.1 g/dL — CL (ref 12.0–15.0)
Immature Granulocytes: 0 %
Lymphocytes Relative: 38 %
Lymphs Abs: 1.8 10*3/uL (ref 0.7–4.0)
MCH: 17.2 pg — ABNORMAL LOW (ref 26.0–34.0)
MCHC: 26.4 g/dL — ABNORMAL LOW (ref 30.0–36.0)
MCV: 65.2 fL — ABNORMAL LOW (ref 80.0–100.0)
Monocytes Absolute: 0.3 10*3/uL (ref 0.1–1.0)
Monocytes Relative: 7 %
Neutro Abs: 2.5 10*3/uL (ref 1.7–7.7)
Neutrophils Relative %: 52 %
Platelets: 671 10*3/uL — ABNORMAL HIGH (ref 150–400)
RBC: 2.96 MIL/uL — ABNORMAL LOW (ref 3.87–5.11)
RDW: 29.8 % — ABNORMAL HIGH (ref 11.5–15.5)
WBC: 4.7 10*3/uL (ref 4.0–10.5)
nRBC: 0 % (ref 0.0–0.2)

## 2019-09-28 LAB — FERRITIN: Ferritin: 1 ng/mL — ABNORMAL LOW (ref 11–307)

## 2019-09-28 LAB — TSH: TSH: 2.442 u[IU]/mL (ref 0.350–4.500)

## 2019-09-28 LAB — POC OCCULT BLOOD, ED: Fecal Occult Bld: NEGATIVE

## 2019-09-28 LAB — PREPARE RBC (CROSSMATCH)

## 2019-09-28 MED ORDER — ACETAMINOPHEN 325 MG PO TABS: 650.0000 mg | ORAL_TABLET | Freq: Four times a day (QID) | ORAL | Status: DC | PRN

## 2019-09-28 MED ORDER — SODIUM CHLORIDE 0.9 % IV SOLN
500.0000 mg | Freq: Once | INTRAVENOUS | Status: AC
  Administered 2019-09-29: 500 mg via INTRAVENOUS
  Filled 2019-09-28: qty 2

## 2019-09-28 MED ORDER — SODIUM CHLORIDE 0.9 % IV SOLN
25.0000 mg | Freq: Once | INTRAVENOUS | Status: AC
  Administered 2019-09-28: 25 mg via INTRAVENOUS
  Filled 2019-09-28: qty 0.5

## 2019-09-28 MED ORDER — SODIUM CHLORIDE 0.9% FLUSH
3.0000 mL | Freq: Two times a day (BID) | INTRAVENOUS | Status: DC
  Administered 2019-09-28: 3 mL via INTRAVENOUS

## 2019-09-28 MED ORDER — ACETAMINOPHEN 650 MG RE SUPP: 650.0000 mg | Freq: Four times a day (QID) | RECTAL | Status: DC | PRN

## 2019-09-28 MED ORDER — HYDROCHLOROTHIAZIDE 12.5 MG PO CAPS
12.5000 mg | ORAL_CAPSULE | Freq: Every day | ORAL | Status: DC
  Administered 2019-09-28 – 2019-09-29 (×2): 12.5 mg via ORAL
  Filled 2019-09-28 (×2): qty 1

## 2019-09-28 MED ORDER — SODIUM CHLORIDE 0.9% IV SOLUTION: Freq: Once | INTRAVENOUS | Status: AC

## 2019-09-28 MED ORDER — LISINOPRIL 10 MG PO TABS
10.0000 mg | ORAL_TABLET | Freq: Every day | ORAL | Status: DC
  Administered 2019-09-28 – 2019-09-29 (×2): 10 mg via ORAL
  Filled 2019-09-28 (×2): qty 1

## 2019-09-28 MED ORDER — LISINOPRIL-HYDROCHLOROTHIAZIDE 10-12.5 MG PO TABS: 1.0000 | ORAL_TABLET | Freq: Every day | ORAL | Status: DC

## 2019-09-28 NOTE — ED Provider Notes (Signed)
[facility]-EMERGENCY DEPT Provider Note   CSN: [id number] Arrival date & time: 09/28/19  1120     History Chief Complaint  Patient presents with  . Abnormal Lab    Stacie Gibson is a 44 y.o. female.  HPI   Pt is a 44 year old female with a medical history as noted below.  Patient states that Stacie Gibson was evaluated at urgent care 5 days ago for vaginal discharge.  Stacie Gibson was discharged on Flagyl which Stacie Gibson has been taking.  Stacie Gibson states that Stacie Gibson was recently notified that Stacie Gibson has a hemoglobin of 5.2.  Stacie Gibson was instructed to come to the emergency department.  Patient notes a history of low hemoglobin in the past and states that Stacie Gibson has had a blood transfusion for this.  Stacie Gibson notes some intermittent lightheadedness as well as shortness of breath with exertion but states this is sporadic and has not been a persistent issue that Stacie Gibson was concerned about.  Stacie Gibson denies any hematochezia or hematemesis.  No melena.  Stacie Gibson states that at times will experience heavy bleeding with her menstrual cycles but denies that this is a regular occurrence.  No abdominal pain, chest pain. Patient does not drink alcohol.  Stacie Gibson does not regularly take NSAIDs.  Patient states that Stacie Gibson took ferrous sulfate in the past but discontinued this medication.     Past Medical History:  Diagnosis Date  . Anxiety   . Chest pain   . Hypertension   . Pharyngitis   . PTSD (post-traumatic stress disorder)   . Thyromegaly     Patient Active Problem List   Diagnosis Date Noted  . Iron deficiency anemia 03/02/2014  . Symptomatic anemia 03/01/2014  . Hypokalemia 03/01/2014  . Chest pain 10/02/2010  . Hypertension 10/02/2010  . Thyromegaly 10/02/2010    History reviewed. No pertinent surgical history.   OB History   No obstetric history on file.     Family History  Problem Relation Age of Onset  . Hypertension Mother     Social History   Tobacco Use  . Smoking status: Never Smoker  . Smokeless  tobacco: Never Used  Vaping Use  . Vaping Use: Never assessed  Substance Use Topics  . Alcohol use: No  . Drug use: No    Home Medications Prior to Admission medications   Medication Sig Start Date End Date Taking? Authorizing Provider  acetaminophen (TYLENOL) 500 MG tablet Take 500 mg by mouth every 6 (six) hours as needed for moderate pain or headache.    [provider]  ferrous sulfate 325 (65 FE) MG tablet Take 1 tablet (325 mg total) by mouth 3 (three) times daily with meals. Patient not taking: Reported on 12/09/2014 03/02/14   Rodolph Bong, MD  ibuprofen (ADVIL,MOTRIN) 200 MG tablet Take 400 mg by mouth every 6 (six) hours as needed for headache or moderate pain.     [provider]  lisinopril-hydrochlorothiazide (PRINZIDE) 10-12.5 MG per tablet Take 1 tablet by mouth daily. Patient not taking: Reported on 03/01/2014 08/21/12   Jaynie Crumble, PA-C  metroNIDAZOLE (FLAGYL) 500 MG tablet Take 1 tablet (500 mg total) by mouth 2 (two) times daily. 09/24/19   Lamptey, Britta Mccreedy, MD    Allergies    Patient has no known allergies.  Review of Systems   Review of Systems  All other systems reviewed and are negative. Ten systems reviewed and are negative for acute change, except as noted in the HPI.   Physical Exam  Updated Vital Signs BP 132/88 (BP Location: Left Arm)   Pulse 88   Temp 98.4 F (36.9 C) (Oral)   Resp 18   SpO2 99%   Physical Exam Vitals and nursing note reviewed.  Constitutional:      General: Stacie Gibson is not in acute distress.    Appearance: Normal appearance. Stacie Gibson is normal weight. Stacie Gibson is not ill-appearing, toxic-appearing or diaphoretic.  HENT:     Head: Normocephalic and atraumatic.     Right Ear: External ear normal.     Left Ear: External ear normal.     Nose: Nose normal.     Mouth/Throat:     Mouth: Mucous membranes are moist.     Pharynx: Oropharynx is clear. No oropharyngeal exudate or posterior oropharyngeal erythema.  Eyes:       General: No scleral icterus.       Right eye: No discharge.        Left eye: No discharge.     Extraocular Movements: Extraocular movements intact.     Conjunctiva/sclera: Conjunctivae normal.     Pupils: Pupils are equal, round, and reactive to light.     Comments: Pallor noted along the inferior palpebral conjunctiva.  Cardiovascular:     Rate and Rhythm: Normal rate and regular rhythm.     Pulses: Normal pulses.     Heart sounds: Normal heart sounds. No murmur heard.  No friction rub. No gallop.   Pulmonary:     Effort: Pulmonary effort is normal. No respiratory distress.     Breath sounds: Normal breath sounds. No stridor. No wheezing, rhonchi or rales.  Abdominal:     General: Abdomen is flat.     Palpations: Abdomen is soft.     Tenderness: There is no abdominal tenderness.     Comments: Abdomen is soft and nontender in all 4 quadrants.  Genitourinary:    Comments: Female nursing chaperone present.  Normal-appearing anal region.  No blood or stool appreciated in the rectal vault.  No tenderness noted throughout the exam.  No palpable internal hemorrhoids noted. Musculoskeletal:        General: Normal range of motion.     Cervical back: Normal range of motion and neck supple. No tenderness.  Skin:    General: Skin is warm and dry.  Neurological:     General: No focal deficit present.     Mental Status: Stacie Gibson is alert and oriented to person, place, and time.  Psychiatric:        Mood and Affect: Mood normal.        Behavior: Behavior normal.    ED Results / Procedures / Treatments   Labs (all labs ordered are listed, but only abnormal results are displayed) Labs Reviewed  BASIC METABOLIC PANEL - Abnormal; Notable for the following components:      Result Value   Potassium 3.4 (*)    All other components within normal limits  CBC WITH DIFFERENTIAL/PLATELET - Abnormal; Notable for the following components:   RBC 2.96 (*)    Hemoglobin 5.1 (*)    HCT 19.3 (*)    MCV  65.2 (*)    MCH 17.2 (*)    MCHC 26.4 (*)    RDW 29.8 (*)    Platelets 671 (*)    All other components within normal limits  IRON AND TIBC - Abnormal; Notable for the following components:   Iron 11 (*)    TIBC 552 (*)    Saturation Ratios 2 (*)  All other components within normal limits  RETICULOCYTES - Abnormal; Notable for the following components:   RBC. 2.97 (*)    All other components within normal limits  FERRITIN - Abnormal; Notable for the following components:   Ferritin 1 (*)    All other components within normal limits  SARS CORONAVIRUS 2 BY RT PCR (HOSPITAL ORDER, PERFORMED IN [facility])  PROTIME-INR  POC OCCULT BLOOD, ED  TYPE AND SCREEN  PREPARE RBC (CROSSMATCH)   EKG None  Radiology No results found.  Procedures Procedures (including critical care time)  Medications Ordered in ED Medications  0.9 %  sodium chloride infusion (Manually program via Guardrails IV Fluids) (0 mLs Intravenous Stopped 09/28/19 1346)   ED Course  I have reviewed the triage vital signs and the nursing notes.  Pertinent labs & imaging results that were available during my care of the patient were reviewed by me and considered in my medical decision making (see chart for details).  Clinical Course as of Sep 28 1415  Tue Sep 28, 2019  1222 Fecal Occult Blood, POC: NEGATIVE [LJ]  1310 Hemoglobin(!!): 5.1 [LJ]  1310 MCV(!): 65.2 [LJ]  1310 Platelets(!): 671 [LJ]  1310 RBC.(!): 2.97 [LJ]  1414 Ferritin(!): 1 [LJ]  1414 Iron(!): 11 [LJ]  1414 TIBC(!): 552 [LJ]    Clinical Course User Index [LJ] Placido Sou, PA-C   MDM Rules/Calculators/A&P                          Pt is a 44 y.o. female that presents with a history, physical exam, and ED Clinical Course as noted above.   Patient presents today due to a hemoglobin of 5.1 that was obtained 5 days ago.  This was incidentally found during an urgent care visit for an unrelated complaint.  Patient's vital  signs today are stable.  No tachycardia or hypoxia.  Patient has a history of iron deficiency anemia.  Stacie Gibson has been admitted for this in the past.  Stacie Gibson has received a transfusion in the past.  Her hemoglobin today is 5.1.  MCV of 65.2.  Patient notes some mild lightheadedness and shortness of breath with exertion but states that this is not constant.  Stacie Gibson does not feel that the symptoms are severe and notes that Stacie Gibson did not think much of them.  Stacie Gibson states Stacie Gibson took ferrous sulfate in the past but discontinued this medication sometime ago.  Patient started on 2 units of PRBCs.  I additionally obtained iron, TIBC, and ferritin levels.  These are pending.  Will discuss with the hospitalist for admission at this time.  COVID-19 test has been ordered.  Note: Portions of this report may have been transcribed using voice recognition software. Every effort was made to ensure accuracy; however, inadvertent computerized transcription errors may be present.   Final Clinical Impression(s) / ED Diagnoses Final diagnoses:  Symptomatic anemia   Rx / DC Orders ED Discharge Orders    None       Placido Sou, PA-C 09/28/19 1418    Sabas Sous, MD 09/28/19 1520

## 2019-09-28 NOTE — ED Triage Notes (Addendum)
Pt arrived via walk in, states she got a call from urgent care 9/17 stating hemoglobin 5.2 and needed to go to ER, pt unable to get be seen until today.   Endorses some fatigue, denies any other sx.

## 2019-09-28 NOTE — H&P (Signed)
History and Physical        Hospital Admission Note Date: 09/28/2019  Patient name: Stacie Gibson Medical record number: [id number] Date of birth: August 10, 1975 Age: 44 y.o. Gender: female  PCP: Particia Nearing, PA-C  Patient coming from: Home  Chief Complaint    Chief Complaint  Patient presents with  . Abnormal Lab      HPI:   This is a 44 year old female with past medical history of iron deficiency anemia, hypertension, anxiety, PTSD who was evaluated at an urgent care 5 days ago for vaginal discharge and had a CBC performed and was discharged on Flagyl.  Her lab work came back abnormal with an Hb of 5.2 and she was instructed to come to the ED.  She has a history of anemia and has had blood transfusions in the past.  She has had some lightheadedness as well as SOB and DOE.  Denies hematochezia or hematemesis, no melena.  She does experience occasional heavy bleeding with her menstrual cycles.  Currently nearing the end of her menstrual cycle but states this is not a particularly heavy week.  Does not have a gynecologist, no known history of fibroids.  Had an episode of substernal chest pain on exertion relieved by rest about 2 months ago which never recurred.  Currently without chest pain, abdominal pain, NSAID use, alcohol use.  She has not been taking her ferrous sulfate.   ED Course: Afebrile and hemodynamically stable on room air.  Lab work notable for K3.4, Hb 5.1, MCV 65, platelets 671 with target cells, teardrop cells and polychromasia present, negative FOBT, negative COVID-19.  2 units PRBCs have been ordered.  Vitals:   09/28/19 1401 09/28/19 1430  BP: 131/89 (!) 147/91  Pulse: 72 65  Resp: 18 18  Temp: 99.1 F (37.3 C)   SpO2: 100% 100%     Review of Systems:  Review of Systems  Constitutional: Positive for malaise/fatigue. Negative for chills and fever.    Respiratory: Negative for wheezing.        Shortness of breath on ambulation  Cardiovascular: Negative for chest pain, palpitations and orthopnea.       Episode of substernal chest pain on exertion relieved by rest 2 months ago which has not recurred  Gastrointestinal: Negative for abdominal pain, blood in stool, melena, nausea and vomiting.  Genitourinary: Negative for dysuria.       Heavy bleeding with menstrual cycle.  Currently nearing the end of her cycle but without heavy bleeding this week.  Regular frequency periods  Musculoskeletal: Negative.   All other systems reviewed and are negative.   Medical/Social/Family History   Past Medical History: Past Medical History:  Diagnosis Date  . Anxiety   . Chest pain   . Hypertension   . Pharyngitis   . PTSD (post-traumatic stress disorder)   . Thyromegaly     History reviewed. No pertinent surgical history.  Medications: Prior to Admission medications   Medication Sig Start Date End Date Taking? Authorizing Provider  acetaminophen (TYLENOL) 500 MG tablet Take 500 mg by mouth every 6 (six) hours as needed for moderate pain or headache.   Yes [provider]  ibuprofen (ADVIL,MOTRIN) 200 MG tablet  Take 400 mg by mouth every 6 (six) hours as needed for headache or moderate pain.    Yes [provider]  metroNIDAZOLE (FLAGYL) 500 MG tablet Take 1 tablet (500 mg total) by mouth 2 (two) times daily. 09/24/19  Yes Lamptey, Britta Mccreedy, MD  ferrous sulfate 325 (65 FE) MG tablet Take 1 tablet (325 mg total) by mouth 3 (three) times daily with meals. Patient not taking: Reported on 12/09/2014 03/02/14   Rodolph Bong, MD  lisinopril-hydrochlorothiazide (PRINZIDE) 10-12.5 MG per tablet Take 1 tablet by mouth daily. Patient not taking: Reported on 03/01/2014 08/21/12   Jaynie Crumble, PA-C    Allergies:  No Known Allergies  Social History:  reports that she has never smoked. She has never used smokeless tobacco. She  reports that she does not drink alcohol and does not use drugs.  Family History: Family History  Problem Relation Age of Onset  . Hypertension Mother      Objective   Physical Exam: Blood pressure (!) 147/91, pulse 65, temperature 99.1 F (37.3 C), temperature source Oral, resp. rate 18, height 5\' 8"  (1.727 m), weight 86.2 kg, SpO2 100 %.  Physical Exam Vitals and nursing note reviewed.  Constitutional:      Appearance: Normal appearance.  HENT:     Head: Normocephalic and atraumatic.  Eyes:     Conjunctiva/sclera: Conjunctivae normal.  Cardiovascular:     Rate and Rhythm: Normal rate and regular rhythm.  Pulmonary:     Effort: Pulmonary effort is normal.     Breath sounds: Normal breath sounds.  Abdominal:     General: Abdomen is flat.     Palpations: Abdomen is soft.  Musculoskeletal:        General: No swelling or tenderness.  Skin:    Coloration: Skin is not jaundiced or pale.  Neurological:     Mental Status: She is alert. Mental status is at baseline.  Psychiatric:        Mood and Affect: Mood normal.        Behavior: Behavior normal.     LABS on Admission: I have personally reviewed all the labs and imaging below    Basic Metabolic Panel: Recent Labs  Lab 09/28/19 1206  NA 140  K 3.4*  CL 104  CO2 23  GLUCOSE 87  BUN 11  CREATININE 0.68  CALCIUM 9.3   Liver Function Tests: No results for input(s): AST, ALT, ALKPHOS, BILITOT, PROT, ALBUMIN in the last 168 hours. No results for input(s): LIPASE, AMYLASE in the last 168 hours. No results for input(s): AMMONIA in the last 168 hours. CBC: Recent Labs  Lab 09/23/19 1520 09/23/19 1520 09/28/19 1206  WBC 7.1  --  4.7  NEUTROABS 4.6   < > 2.5  HGB 5.2*  --  5.1*  HCT 20.0*  --  19.3*  MCV 63.5*   < > 65.2*  PLT 199  --  671*   < > = values in this interval not displayed.   Cardiac Enzymes: No results for input(s): CKTOTAL, CKMB, CKMBINDEX, TROPONINI in the last 168 hours. BNP: Invalid  input(s): POCBNP CBG: No results for input(s): GLUCAP in the last 168 hours.  Radiological Exams on Admission:  No results found.    EKG: Not done   A & P   Principal Problem:   Symptomatic anemia Active Problems:   Hypertension   Iron deficiency anemia   1. Acute on chronic iron deficiency anemia  Symptomatic anemia a. Hemodynamically stable.  Symptoms of fatigue and dyspnea on exertion with an episode of chest pain on exertion 2 months ago likely from anemia b. Occasionally heavy menstrual bleeding c. Ferritin 1, Hb 5.1 d. Transfuse for goal Hb > 7.0 e. IV iron  f. TOC consult to help patient establish with a gynecologist  2. Hypertension a. Continue home lisinopril-HCTZ   DVT prophylaxis: SCDs   Code Status: Prior  Diet: Heart healthy Family Communication: Admission, patients condition and plan of care including tests being ordered have been discussed with the patient who indicates understanding and agrees with the plan and Code Status.  Disposition Plan: The appropriate patient status for this patient is OBSERVATION. Observation status is judged to be reasonable and necessary in order to provide the required intensity of service to ensure the patient's safety. The patient's presenting symptoms, physical exam findings, and initial radiographic and laboratory data in the context of their medical condition is felt to place them at decreased risk for further clinical deterioration. Furthermore, it is anticipated that the patient will be medically stable for discharge from the hospital within 2 midnights of admission. The following factors support the patient status of observation.   " The patient's presenting symptoms include dyspnea on exertion and fatigue. " The physical exam findings include unremarkable. " The initial radiographic and laboratory data are Hb 5.1.    Status is: Observation  The patient remains OBS appropriate and will d/c before 2 midnights.  Dispo:  The patient is from: Home              Anticipated d/c is to: Home              Anticipated d/c date is: 1 day              Patient currently is not medically stable to d/c.    Consultants  . None  Procedures  . None  Time Spent on Admission: 55 minutes    Jae Dire, DO [facility] Pager [phone] 09/28/2019, 3:00 PM

## 2019-09-28 NOTE — ED Notes (Signed)
CRITICAL VALUE ALERT  Critical Value:  hgb 5.1  Date & Time Notied:  09/28/2019  Provider Notified: Whitney Post, PA  Orders Received/Actions taken:

## 2019-09-28 NOTE — ED Notes (Signed)
Blood bank has red cells ready, informed Danielle,RN

## 2019-09-29 ENCOUNTER — Observation Stay: Payer: Self-pay

## 2019-09-29 DIAGNOSIS — Z23 Encounter for immunization: Secondary | ICD-10-CM

## 2019-09-29 LAB — CBC
HCT: 23.6 % — ABNORMAL LOW (ref 36.0–46.0)
Hemoglobin: 6.8 g/dL — CL (ref 12.0–15.0)
MCH: 20.4 pg — ABNORMAL LOW (ref 26.0–34.0)
MCHC: 28.8 g/dL — ABNORMAL LOW (ref 30.0–36.0)
MCV: 70.9 fL — ABNORMAL LOW (ref 80.0–100.0)
Platelets: 502 10*3/uL — ABNORMAL HIGH (ref 150–400)
RBC: 3.33 MIL/uL — ABNORMAL LOW (ref 3.87–5.11)
RDW: 30.3 % — ABNORMAL HIGH (ref 11.5–15.5)
WBC: 4.7 10*3/uL (ref 4.0–10.5)
nRBC: 0 % (ref 0.0–0.2)

## 2019-09-29 LAB — BASIC METABOLIC PANEL
Anion gap: 8 (ref 5–15)
BUN: 9 mg/dL (ref 6–20)
CO2: 23 mmol/L (ref 22–32)
Calcium: 8.4 mg/dL — ABNORMAL LOW (ref 8.9–10.3)
Chloride: 105 mmol/L (ref 98–111)
Creatinine, Ser: 0.64 mg/dL (ref 0.44–1.00)
GFR calc Af Amer: >60 mL/min (ref >60)
GFR calc non Af Amer: >60 mL/min (ref >60)
Glucose, Bld: 88 mg/dL (ref 70–99)
Potassium: 3.7 mmol/L (ref 3.5–5.1)
Sodium: 136 mmol/L (ref 135–145)

## 2019-09-29 LAB — HEMOGLOBIN AND HEMATOCRIT, BLOOD
HCT: 23.5 % — ABNORMAL LOW (ref 36.0–46.0)
Hemoglobin: 6.9 g/dL — CL (ref 12.0–15.0)

## 2019-09-29 LAB — PREPARE RBC (CROSSMATCH)

## 2019-09-29 MED ORDER — INFLUENZA VAC SPLIT QUAD 0.5 ML IM SUSY
0.5000 mL | PREFILLED_SYRINGE | INTRAMUSCULAR | Status: AC | PRN
  Administered 2019-09-29: 0.5 mL via INTRAMUSCULAR
  Filled 2019-09-29: qty 0.5

## 2019-09-29 MED ORDER — FERROUS SULFATE 325 (65 FE) MG PO TABS: 325.0000 mg | ORAL_TABLET | Freq: Two times a day (BID) | ORAL | 12 refills | Status: DC

## 2019-09-29 MED ORDER — SODIUM CHLORIDE 0.9% IV SOLUTION: Freq: Once | INTRAVENOUS | Status: DC

## 2019-09-29 MED ORDER — ACETAMINOPHEN 325 MG PO TABS
650.0000 mg | ORAL_TABLET | Freq: Once | ORAL | Status: AC
  Administered 2019-09-29: 650 mg via ORAL
  Filled 2019-09-29: qty 2

## 2019-09-29 MED ORDER — FUROSEMIDE 10 MG/ML IJ SOLN
20.0000 mg | Freq: Once | INTRAMUSCULAR | Status: AC
  Administered 2019-09-29: 20 mg via INTRAVENOUS
  Filled 2019-09-29: qty 2

## 2019-09-29 NOTE — TOC Progression Note (Signed)
Transition of Care [facility]) - Progression Note    Patient Details  Name: Stacie Gibson MRN: [id number] Date of Birth: 1975/07/08  [facility] CM/SW Contact  Blanchie Zeleznik, Olegario Messier, RN Phone Number: 09/29/2019, 8:44 AM  Clinical Narrative:  CM referral for gynecologist. I can provide resource list for GYN, & pcp list.          Expected Discharge Plan and Services                                                 Social Determinants of Health (SDOH) Interventions    Readmission Risk Interventions No flowsheet data found.

## 2019-09-29 NOTE — Discharge Summary (Signed)
Physician Discharge Summary  Stacie Gibson:[id number] DOB: 1975/09/01 DOA: 09/28/2019  PCP: Particia Nearing, PA-C  Admit date: 09/28/2019 Discharge date: 09/29/2019  Time spent: 25 minutes  Recommendations for Outpatient Follow-up:  1. Needs outpatient CBC Chem-12 2. Recommend screening ultrasound transvaginal to rule out fibroids or other causes 3. May benefit from being placed on estrogen/Provera for breakthrough bleeding as per PCP  Discharge Diagnoses:  Principal Problem:   Symptomatic anemia Active Problems:   Hypertension   Iron deficiency anemia   Discharge Condition: Improved  Diet recommendation: Heart healthy low-salt  Filed Weights   09/28/19 1351  Weight: 86.2 kg    History of present illness:  44 year old black female with iron deficiency anemia HTN PTSD anxiety prior admission 2018 for severe menorrhagia never followed up Recent evaluation at urgent care Rx bacterial vaginosis RPR positive but FTA-ABS negative Found to have a hemoglobin of 5 and was admitted for symptomatic anemia She never followed up with OB/GYN after last hospital admission She was transfused 3 units PRBC Iron studies were obtained and she was quite low with iron level of 11 she was given IV iron infusion as well I explained to her clearly and carefully that she will need to have repeat lab draws in the next week or so and follow-up with PCP for care coordination-she may be a candidate for Provera or OCP given her risk profile She will need to practice safe sex practices She was discharged home in a stable state  Discharge Exam: Vitals:   09/29/19 0057 09/29/19 0610  BP: 116/81 108/64  Pulse: 61 (!) 47  Resp: 20 18  Temp: 98.9 F (37.2 C) 98.2 F (36.8 C)  SpO2: 100% 100%    General: Awake coherent slightly apprehensive no distress Cardiovascular: S1-S2 no murmur rub or gallop Respiratory: Clinically clear no added sound Abdomen slight distended bladder is  enlarged Neurologically intact no focal deficit  Discharge Instructions    Allergies as of 09/29/2019   No Known Allergies     Medication List    TAKE these medications   acetaminophen 500 MG tablet Commonly known as: TYLENOL Take 500 mg by mouth every 6 (six) hours as needed for moderate pain or headache.   ferrous sulfate 325 (65 FE) MG tablet Take 1 tablet (325 mg total) by mouth 2 (two) times daily with a meal. What changed: when to take this   ibuprofen 200 MG tablet Commonly known as: ADVIL Take 400 mg by mouth every 6 (six) hours as needed for headache or moderate pain.   lisinopril-hydrochlorothiazide 10-12.5 MG tablet Commonly known as: Prinzide Take 1 tablet by mouth daily.   metroNIDAZOLE 500 MG tablet Commonly known as: FLAGYL Take 1 tablet (500 mg total) by mouth 2 (two) times daily.      No Known Allergies    The results of significant diagnostics from this hospitalization (including imaging, microbiology, ancillary and laboratory) are listed below for reference.    Significant Diagnostic Studies: No results found.  Microbiology: Recent Results (from the past 240 hour(s))  SARS Coronavirus 2 by RT PCR (hospital order, performed in [facility] hospital lab) Nasopharyngeal Nasopharyngeal Swab     Status: None   Collection Time: 09/28/19  1:06 PM   Specimen: Nasopharyngeal Swab  Result Value Ref Range Status   SARS Coronavirus 2 NEGATIVE NEGATIVE Final    Comment: (NOTE) SARS-CoV-2 target nucleic acids are NOT DETECTED.  The SARS-CoV-2 RNA is generally detectable in upper and lower respiratory specimens  during the acute phase of infection. The lowest concentration of SARS-CoV-2 viral copies this assay can detect is 250 copies / mL. A negative result does not preclude SARS-CoV-2 infection and should not be used as the sole basis for treatment or other patient management decisions.  A negative result may occur with improper specimen collection /  handling, submission of specimen other than nasopharyngeal swab, presence of viral mutation(s) within the areas targeted by this assay, and inadequate number of viral copies (<250 copies / mL). A negative result must be combined with clinical observations, patient history, and epidemiological information.  Fact Sheet for Patients:   BoilerBrush.com.cy  Fact Sheet for Healthcare Providers: https://pope.com/  This test is not yet approved or  cleared by the [place] FDA and has been authorized for detection and/or diagnosis of SARS-CoV-2 by FDA under an Emergency Use Authorization (EUA).  This EUA will remain in effect (meaning this test can be used) for the duration of the COVID-19 declaration under Section 564(b)(1) of the Act, 21 U.S.C. section 360bbb-3(b)(1), unless the authorization is terminated or revoked sooner.  Performed at [facility], [address] [place], [place] [address]      Labs: Basic Metabolic Panel: Recent Labs  Lab 09/28/19 1206 09/29/19 0535  NA 140 136  K 3.4* 3.7  CL 104 105  CO2 23 23  GLUCOSE 87 88  BUN 11 9  CREATININE 0.68 0.64  CALCIUM 9.3 8.4*   Liver Function Tests: No results for input(s): AST, ALT, ALKPHOS, BILITOT, PROT, ALBUMIN in the last 168 hours. No results for input(s): LIPASE, AMYLASE in the last 168 hours. No results for input(s): AMMONIA in the last 168 hours. CBC: Recent Labs  Lab 09/23/19 1520 09/28/19 1206 09/29/19 0052 09/29/19 0535  WBC 7.1 4.7  --  4.7  NEUTROABS 4.6 2.5  --   --   HGB 5.2* 5.1* 6.9* 6.8*  HCT 20.0* 19.3* 23.5* 23.6*  MCV 63.5* 65.2*  --  70.9*  PLT 199 671*  --  502*   Cardiac Enzymes: No results for input(s): CKTOTAL, CKMB, CKMBINDEX, TROPONINI in the last 168 hours. BNP: BNP (last 3 results) No results for input(s): BNP in the last 8760 hours.  ProBNP (last 3 results) No results for input(s): PROBNP in the last  8760 hours.  CBG: No results for input(s): GLUCAP in the last 168 hours.     Signed:  Rhetta Mura MD   [facility] 09/29/2019, 9:30 AM

## 2019-09-29 NOTE — Progress Notes (Signed)
RN reviewed discharge instructions with patient and family. All questions answered.   Paperwork given. Prescriptions electronically sent to patient pharmacy.    NT rolled patient down with all belongings to family car.    Discharged from floor at 1600.    Deneise Lever, RN

## 2019-09-29 NOTE — Progress Notes (Signed)
   Covid-19 Vaccination Clinic  Name:  Stacie Gibson    MRN: [id number] DOB: 04-Oct-1975  09/29/2019  [name] was observed post Covid-19 immunization for 15 minutes without incident. She was provided with Vaccine Information Sheet and instruction to access the V-Safe system.   [name] was instructed to call 911 with any severe reactions post vaccine: Marland Kitchen Difficulty breathing  . Swelling of face and throat  . A fast heartbeat  . A bad rash all over body  . Dizziness and weakness   Immunizations Administered    Name Date Dose VIS Date Route   JANSSEN COVID-19 VACCINE 09/29/2019 12:42 PM 0.5 mL 03/06/2019 Intramuscular   Manufacturer: [employer]   Lot: [id number]   NDC: 37342-876-81

## 2019-09-30 LAB — TYPE AND SCREEN
ABO/RH(D): O POS
Antibody Screen: NEGATIVE
Unit division: 0
Unit division: 0
Unit division: 0

## 2019-09-30 LAB — BPAM RBC
Blood Product Expiration Date: 202110222359
Blood Product Expiration Date: 202110222359
Blood Product Expiration Date: 202110222359
ISSUE DATE / TIME: 202109211342
ISSUE DATE / TIME: 202109211821
ISSUE DATE / TIME: 202109221052
Unit Type and Rh: 5100
Unit Type and Rh: 5100
Unit Type and Rh: 5100

## 2020-08-03 ENCOUNTER — Other Ambulatory Visit: Payer: Self-pay

## 2020-08-03 ENCOUNTER — Ambulatory Visit (HOSPITAL_COMMUNITY)
Admission: EM | Admit: 2020-08-03 | Discharge: 2020-08-03 | Disposition: A | Discharge status: Home / Self Care | Payer: Self-pay | Attending: Internal Medicine | Admitting: Internal Medicine

## 2020-08-03 ENCOUNTER — Ambulatory Visit (INDEPENDENT_AMBULATORY_CARE_PROVIDER_SITE_OTHER): Payer: Self-pay

## 2020-08-03 DIAGNOSIS — R0602 Shortness of breath: Secondary | ICD-10-CM

## 2020-08-03 DIAGNOSIS — Z20822 Contact with and (suspected) exposure to covid-19: Secondary | ICD-10-CM | POA: Insufficient documentation

## 2020-08-03 DIAGNOSIS — R42 Dizziness and giddiness: Secondary | ICD-10-CM

## 2020-08-03 DIAGNOSIS — I160 Hypertensive urgency: Secondary | ICD-10-CM

## 2020-08-03 DIAGNOSIS — R519 Headache, unspecified: Secondary | ICD-10-CM

## 2020-08-03 DIAGNOSIS — Z113 Encounter for screening for infections with a predominantly sexual mode of transmission: Secondary | ICD-10-CM

## 2020-08-03 LAB — CBC
HCT: 36.1 % (ref 36.0–46.0)
Hemoglobin: 10.8 g/dL — ABNORMAL LOW (ref 12.0–15.0)
MCH: 22 pg — ABNORMAL LOW (ref 26.0–34.0)
MCHC: 29.9 g/dL — ABNORMAL LOW (ref 30.0–36.0)
MCV: 73.7 fL — ABNORMAL LOW (ref 80.0–100.0)
Platelets: 403 10*3/uL — ABNORMAL HIGH (ref 150–400)
RBC: 4.9 MIL/uL (ref 3.87–5.11)
RDW: 17.2 % — ABNORMAL HIGH (ref 11.5–15.5)
WBC: 5.7 10*3/uL (ref 4.0–10.5)
nRBC: 0 % (ref 0.0–0.2)

## 2020-08-03 LAB — SARS CORONAVIRUS 2 (TAT 6-24 HRS): SARS Coronavirus 2: NEGATIVE

## 2020-08-03 LAB — COMPREHENSIVE METABOLIC PANEL
ALT: 13 U/L (ref 0–44)
AST: 21 U/L (ref 15–41)
Albumin: 4.1 g/dL (ref 3.5–5.0)
Alkaline Phosphatase: 25 U/L — ABNORMAL LOW (ref 38–126)
Anion gap: 7 (ref 5–15)
BUN: 8 mg/dL (ref 6–20)
CO2: 26 mmol/L (ref 22–32)
Calcium: 9.3 mg/dL (ref 8.9–10.3)
Chloride: 103 mmol/L (ref 98–111)
Creatinine, Ser: 0.69 mg/dL (ref 0.44–1.00)
GFR, Estimated: >60 mL/min (ref >60)
Glucose, Bld: 88 mg/dL (ref 70–99)
Potassium: 3.5 mmol/L (ref 3.5–5.1)
Sodium: 136 mmol/L (ref 135–145)
Total Bilirubin: 2 mg/dL — ABNORMAL HIGH (ref 0.3–1.2)
Total Protein: 8.5 g/dL — ABNORMAL HIGH (ref 6.5–8.1)

## 2020-08-03 LAB — HIV ANTIBODY (ROUTINE TESTING W REFLEX): HIV Screen 4th Generation wRfx: NONREACTIVE

## 2020-08-03 MED ORDER — AMLODIPINE BESYLATE 5 MG PO TABS: 5.0000 mg | ORAL_TABLET | Freq: Every day | ORAL | 0 refills | Status: DC

## 2020-08-03 NOTE — ED Provider Notes (Signed)
[facility]    CSN: [id number] Arrival date & time: 08/03/20  1117      History   Chief Complaint Chief Complaint  Patient presents with   Night Sweats    HPI Stacie Gibson is a 45 y.o. female.   Patient presents to the urgent care with 1 week history of dizziness, intermittent mild headaches, intermittent blurred vision.  Blurred vision occurs intermittently when looking at and reading papers mainly.  Patient also developed intermittent shortness of breath approximately 2 to 3 days ago.  Denies orthopnea.  Denies chest pain.  Denies any upper respiratory symptoms, fever, or known sick contacts.  Patient also requesting HIV testing.  Denies any known exposure to HIV.  Patient simply wants routine testing.  Patient was hospitalized in September for anemia and a hemoglobin of 5.2.  Patient was advised to follow-up with PCP but has not yet done so.  Patient states that she has a PCP appointment on August 15.  Was prescribed lisinopril-HCTZ for hypertension on discharge of hospital but patient never picked up this medication.    Past Medical History:  Diagnosis Date   Anxiety    Chest pain    Hypertension    Pharyngitis    PTSD (post-traumatic stress disorder)    Thyromegaly     Patient Active Problem List   Diagnosis Date Noted   Iron deficiency anemia 03/02/2014   Symptomatic anemia 03/01/2014   Hypokalemia 03/01/2014   Chest pain 10/02/2010   Hypertension 10/02/2010   Thyromegaly 10/02/2010    No past surgical history on file.  OB History   No obstetric history on file.      Home Medications    Prior to Admission medications   Medication Sig Start Date End Date Taking? Authorizing Provider  amLODipine (NORVASC) 5 MG tablet Take 1 tablet (5 mg total) by mouth daily. 08/03/20  Yes Lance Muss, FNP  acetaminophen (TYLENOL) 500 MG tablet Take 500 mg by mouth every 6 (six) hours as needed for moderate pain or headache.    [provider]   ferrous sulfate 325 (65 FE) MG tablet Take 1 tablet (325 mg total) by mouth 2 (two) times daily with a meal. 09/29/19   Rhetta Mura, MD  ibuprofen (ADVIL,MOTRIN) 200 MG tablet Take 400 mg by mouth every 6 (six) hours as needed for headache or moderate pain.     [provider]  lisinopril-hydrochlorothiazide (PRINZIDE) 10-12.5 MG per tablet Take 1 tablet by mouth daily. Patient not taking: Reported on 03/01/2014 08/21/12   Jaynie Crumble, PA-C  metroNIDAZOLE (FLAGYL) 500 MG tablet Take 1 tablet (500 mg total) by mouth 2 (two) times daily. 09/24/19   LampteyBritta Mccreedy, MD    Family History Family History  Problem Relation Age of Onset   Hypertension Mother     Social History Social History   Tobacco Use   Smoking status: Never   Smokeless tobacco: Never  Substance Use Topics   Alcohol use: No   Drug use: No     Allergies   Patient has no known allergies.   Review of Systems Review of Systems Per HPI  Physical Exam Triage Vital Signs ED Triage Vitals  Enc Vitals Group     BP 08/03/20 1236 (!) 171/107     Pulse Rate 08/03/20 1236 69     Resp 08/03/20 1236 20     Temp 08/03/20 1236 98.8 F (37.1 C)     Temp Source 08/03/20 1236 Oral  SpO2 08/03/20 1236 97 %     Weight --      Height --      Head Circumference --      Peak Flow --      Pain Score 08/03/20 1233 0     Pain Loc --      Pain Edu? --      Excl. in GC? --    No data found.  Updated Vital Signs BP (!) 163/110 (BP Location: Right Arm)   Pulse 69   Temp 98.8 F (37.1 C) (Oral)   Resp 20   LMP 07/26/2020 (Approximate)   SpO2 97%   Visual Acuity Right Eye Distance:   Left Eye Distance:   Bilateral Distance:    Right Eye Near:   Left Eye Near:    Bilateral Near:     Physical Exam Constitutional:      General: She is not in acute distress.    Appearance: Normal appearance. She is not ill-appearing, toxic-appearing or diaphoretic.  HENT:     Head: Normocephalic and  atraumatic.     Right Ear: Tympanic membrane and ear canal normal.     Left Ear: Tympanic membrane and ear canal normal.     Nose: Nose normal.     Mouth/Throat:     Mouth: Mucous membranes are moist.  Eyes:     Extraocular Movements: Extraocular movements intact.     Conjunctiva/sclera: Conjunctivae normal.  Cardiovascular:     Rate and Rhythm: Normal rate and regular rhythm.     Pulses: Normal pulses.     Heart sounds: Normal heart sounds.  Pulmonary:     Effort: Pulmonary effort is normal. No respiratory distress.     Breath sounds: Normal breath sounds. No wheezing, rhonchi or rales.  Abdominal:     General: Abdomen is flat. Bowel sounds are normal.     Palpations: Abdomen is soft.  Skin:    General: Skin is warm and dry.  Neurological:     General: No focal deficit present.     Mental Status: She is alert and oriented to person, place, and time. Mental status is at baseline.     Cranial Nerves: Cranial nerves are intact.     Sensory: Sensation is intact.     Motor: Motor function is intact.     Coordination: Coordination is intact.     Gait: Gait is intact.  Psychiatric:        Mood and Affect: Mood normal.        Behavior: Behavior normal.        Thought Content: Thought content normal.        Judgment: Judgment normal.     UC Treatments / Results  Labs (all labs ordered are listed, but only abnormal results are displayed) Labs Reviewed  COMPREHENSIVE METABOLIC PANEL - Abnormal; Notable for the following components:      Result Value   Total Protein 8.5 (*)    Alkaline Phosphatase 25 (*)    Total Bilirubin 2.0 (*)    All other components within normal limits  CBC - Abnormal; Notable for the following components:   Hemoglobin 10.8 (*)    MCV 73.7 (*)    MCH 22.0 (*)    MCHC 29.9 (*)    RDW 17.2 (*)    Platelets 403 (*)    All other components within normal limits  SARS CORONAVIRUS 2 (TAT 6-24 HRS)  HIV ANTIBODY (ROUTINE TESTING W REFLEX)  EKG   Radiology DG Chest 2 View  Result Date: 08/03/2020 CLINICAL DATA:  Night sweats and difficulty breathing for 2 days. EXAM: CHEST - 2 VIEW COMPARISON:  PA and lateral chest 12/09/2014. FINDINGS: Lungs clear. Heart size normal. No pneumothorax or pleural fluid. No acute or focal bony abnormality. IMPRESSION: Negative chest. Electronically Signed   By: Drusilla Kanner M.D.   On: 08/03/2020 14:19    Procedures Procedures (including critical care time)  Medications Ordered in UC Medications - No data to display  Initial Impression / Assessment and Plan / UC Course  I have reviewed the triage vital signs and the nursing notes.  Pertinent labs & imaging results that were available during my care of the patient were reviewed by me and considered in my medical decision making (see chart for details).     Suggested to patient that she be further evaluated at the hospital today due to hypertensive urgency found in urgent care.  Patient refused further evaluation at the hospital.  Risks associated with not going to the hospital were discussed with patient.  Patient voiced understanding.  Chest x-ray was completed for evaluation of shortness of breath but was unremarkable.  COVID 19 test pending as well.  Patient had markedly elevated blood pressure in urgent care today therefore suspect dizziness and headaches are related to blood pressure.  Patient was prescribed amlodipine 5 mg daily to start taking today.  Advised patient to obtain home blood pressure cuff and monitor at least twice daily.  Advised patient to go to the hospital if symptoms significantly worsen or if SBP is elevated consistently over 180.  Patient advised of the importance of following up with PCP on August 15. Discussed strict return precautions. Patient verbalized understanding and is agreeable with plan.  Final Clinical Impressions(s) / UC Diagnoses   Final diagnoses:  Hypertensive urgency  Acute nonintractable  headache, unspecified headache type  Dizziness and giddiness  Screening for venereal disease  Shortness of breath     Discharge Instructions      Your chest x-ray was negative for any abnormalities.  Your COVID test is pending.  We will call if these are positive.  You have been prescribed amlodipine to take for blood pressure daily.  Please obtain a home blood pressure cuff and monitor blood pressure at least twice daily.  Please go to the hospital if blurred vision, dizziness, headache, shortness of breath worsen or if you develop chest pain.  Also go to the hospital if the top number of the blood pressure is consistently elevated above 180.  Your blood work is pending.  We will call if these are positive.     ED Prescriptions     Medication Sig Dispense Auth. Provider   amLODipine (NORVASC) 5 MG tablet Take 1 tablet (5 mg total) by mouth daily. 90 tablet Lance Muss, FNP      PDMP not reviewed this encounter.   Lance Muss, FNP 08/03/20 [phone]

## 2020-08-03 NOTE — ED Triage Notes (Signed)
Pt in requesting HIV testing  Pt states she has been having night sweats and "bad breathing" x 2 days  Pt states she had HIV testing done last year with negative results

## 2020-08-03 NOTE — Discharge Instructions (Addendum)
Your chest x-ray was negative for any abnormalities.  Your COVID test is pending.  We will call if these are positive.  You have been prescribed amlodipine to take for blood pressure daily.  Please obtain a home blood pressure cuff and monitor blood pressure at least twice daily.  Please go to the hospital if blurred vision, dizziness, headache, shortness of breath worsen or if you develop chest pain.  Also go to the hospital if the top number of the blood pressure is consistently elevated above 180.  Your blood work is pending.  We will call if these are positive.

## 2020-09-21 ENCOUNTER — Ambulatory Visit (INDEPENDENT_AMBULATORY_CARE_PROVIDER_SITE_OTHER): Payer: Self-pay | Admitting: Primary Care

## 2020-09-21 ENCOUNTER — Encounter (INDEPENDENT_AMBULATORY_CARE_PROVIDER_SITE_OTHER): Payer: Self-pay | Admitting: Primary Care

## 2020-09-21 ENCOUNTER — Other Ambulatory Visit: Payer: Self-pay

## 2020-09-21 VITALS — BP 164/101 | HR 59 | Temp 97.9°F | Ht 68.0 in | Wt 223.0 lb

## 2020-09-21 DIAGNOSIS — E6609 Other obesity due to excess calories: Secondary | ICD-10-CM

## 2020-09-21 DIAGNOSIS — I10 Essential (primary) hypertension: Secondary | ICD-10-CM

## 2020-09-21 DIAGNOSIS — Z7689 Persons encountering health services in other specified circumstances: Secondary | ICD-10-CM

## 2020-09-21 DIAGNOSIS — D509 Iron deficiency anemia, unspecified: Secondary | ICD-10-CM

## 2020-09-21 DIAGNOSIS — Z23 Encounter for immunization: Secondary | ICD-10-CM

## 2020-09-21 DIAGNOSIS — Z6833 Body mass index (BMI) 33.0-33.9, adult: Secondary | ICD-10-CM

## 2020-09-21 DIAGNOSIS — Z131 Encounter for screening for diabetes mellitus: Secondary | ICD-10-CM

## 2020-09-21 LAB — POCT GLYCOSYLATED HEMOGLOBIN (HGB A1C): Hemoglobin A1C: 5.3 % (ref 4.0–5.6)

## 2020-09-21 MED ORDER — HYDROCHLOROTHIAZIDE 25 MG PO TABS
25.0000 mg | ORAL_TABLET | Freq: Every day | ORAL | 1 refills | Status: DC
  Filled 2020-09-21: qty 90, 90d supply, fill #0

## 2020-09-21 MED ORDER — LISINOPRIL 20 MG PO TABS
20.0000 mg | ORAL_TABLET | Freq: Every day | ORAL | 1 refills | Status: DC
  Filled 2020-09-21: qty 90, 90d supply, fill #0

## 2020-09-21 MED ORDER — AMLODIPINE BESYLATE 10 MG PO TABS
10.0000 mg | ORAL_TABLET | Freq: Every day | ORAL | 1 refills | Status: DC
  Filled 2020-09-21: qty 90, 90d supply, fill #0

## 2020-09-21 MED ORDER — FERROUS SULFATE 325 (65 FE) MG PO TABS
325.0000 mg | ORAL_TABLET | Freq: Every day | ORAL | 3 refills | Status: DC
  Filled 2020-09-21: qty 90, 90d supply, fill #0

## 2020-09-21 NOTE — Progress Notes (Signed)
Established Patient Office Visit  Subjective:  Patient ID: Stacie Gibson, female    DOB: 1975-04-27  Age: 45 y.o. MRN: [id number]  CC: Establishing care   HPI Stacie Gibson is a 45 year old obese female presents for establishment of care and was seen  in July 22 for hypertensive urgency and symptomatic. Stacie Gibson - peer support patient gave permission for her to come to visits.  She has not had Bp medication in a year. Elevated at this visit discuss risk factors. Denies shortness of breath, headaches, chest pain or lower extremity edema.  Past Medical History:  Diagnosis Date  . Anxiety   . Chest pain   . Hypertension   . Pharyngitis   . PTSD (post-traumatic stress disorder)   . Thyromegaly     No past surgical history on file.  Family History  Problem Relation Age of Onset  . Hypertension Mother     Social History   Socioeconomic History  . Marital status: Significant Other    Spouse name: Not on file  . Number of children: 0  . Years of education: Not on file  . Highest education level: Not on file  Occupational History    Comment: Works at [employer]  . Smoking status: Never  . Smokeless tobacco: Never  Vaping Use  . Vaping Use: Not on file  Substance and Sexual Activity  . Alcohol use: No  . Drug use: No  . Sexual activity: Yes  Other Topics Concern  . Not on file  Social History Narrative  . Not on file   Social Determinants of Health   Financial Resource Strain: Not on file  Food Insecurity: Not on file  Transportation Needs: Not on file  Physical Activity: Not on file  Stress: Not on file  Social Connections: Not on file  Intimate Partner Violence: Not on file    Outpatient Medications Prior to Visit  Medication Sig Dispense Refill  . acetaminophen (TYLENOL) 500 MG tablet Take 500 mg by mouth every 6 (six) hours as needed for moderate pain or headache.    Stacie Kitchen amLODipine (NORVASC) 5 MG tablet Take 1 tablet (5 mg total) by mouth  daily. 90 tablet 0  . ferrous sulfate 325 (65 FE) MG tablet Take 1 tablet (325 mg total) by mouth 2 (two) times daily with a meal. 60 tablet 12  . ibuprofen (ADVIL,MOTRIN) 200 MG tablet Take 400 mg by mouth every 6 (six) hours as needed for headache or moderate pain.     Stacie Kitchen lisinopril-hydrochlorothiazide (PRINZIDE) 10-12.5 MG per tablet Take 1 tablet by mouth daily. (Patient not taking: Reported on 03/01/2014) 30 tablet 1  . metroNIDAZOLE (FLAGYL) 500 MG tablet Take 1 tablet (500 mg total) by mouth 2 (two) times daily. 14 tablet 0   No facility-administered medications prior to visit.    No Known Allergies  ROS Review of Systems  Constitutional:  Positive for fatigue.  All other systems reviewed and are negative.    Objective:    Physical Exam Vitals reviewed.  Constitutional:      Appearance: She is obese.  HENT:     Head: Normocephalic and atraumatic.     Right Ear: Tympanic membrane and external ear normal.     Left Ear: Tympanic membrane and external ear normal.     Nose: Nose normal.  Neck:     Comments: Goiter present Cardiovascular:     Rate and Rhythm: Normal rate and regular rhythm.  Pulmonary:     Effort: Pulmonary effort is normal.     Breath sounds: Normal breath sounds.  Abdominal:     General: Bowel sounds are normal.     Palpations: Abdomen is soft.  Musculoskeletal:        General: Normal range of motion.     Cervical back: Normal range of motion and neck supple.  Skin:    General: Skin is warm and dry.  Neurological:     Mental Status: She is oriented to person, place, and time.  Psychiatric:        Mood and Affect: Mood normal.        Behavior: Behavior normal.        Thought Content: Thought content normal.        Judgment: Judgment normal.   There were no vitals taken for this visit. Wt Readings from Last 3 Encounters:  09/28/19 190 lb (86.2 kg)  03/01/14 235 lb 8 oz (106.8 kg)  10/02/10 245 lb (111.1 kg)     Health Maintenance Due  Topic  Date Due  . Hepatitis C Screening  Never done  . TETANUS/TDAP  Never done  . PAP SMEAR-Modifier  Never done  . COLONOSCOPY (Pts 45-8yrs Insurance coverage will need to be confirmed)  Never done  . COVID-19 Vaccine (2 - Booster for Janssen series) 11/24/2019  . INFLUENZA VACCINE  08/07/2020    There are no preventive care reminders to display for this patient.  Lab Results  Component Value Date   TSH 2.442 09/28/2019   Lab Results  Component Value Date   WBC 5.7 08/03/2020   HGB 10.8 (L) 08/03/2020   HCT 36.1 08/03/2020   MCV 73.7 (L) 08/03/2020   PLT 403 (H) 08/03/2020   Lab Results  Component Value Date   NA 136 08/03/2020   K 3.5 08/03/2020   CO2 26 08/03/2020   GLUCOSE 88 08/03/2020   BUN 8 08/03/2020   CREATININE 0.69 08/03/2020   BILITOT 2.0 (H) 08/03/2020   ALKPHOS 25 (L) 08/03/2020   AST 21 08/03/2020   ALT 13 08/03/2020   PROT 8.5 (H) 08/03/2020   ALBUMIN 4.1 08/03/2020   CALCIUM 9.3 08/03/2020   ANIONGAP 7 08/03/2020   GFR 108.58 10/02/2010     Assessment & Plan:  Carrissa was seen today for new patient (initial visit).  Diagnoses and all orders for this visit:  Encounter to establish care Establish care   Iron deficiency anemia, unspecified iron deficiency anemia type Reviewed previous labs -     ferrous sulfate 325 (65 FE) MG tablet; Take 1 tablet (325 mg total) by mouth daily with breakfast.  Primary hypertension Counseled on blood pressure goal of less than 130/80, low-sodium, DASH diet, medication compliance, 150 minutes of moderate intensity exercise per week. Discussed medication compliance, adverse effects. Risk factor AAF, obese, HTN . Discussed sodium and how to read labels  -     amLODipine (NORVASC) 10 MG tablet; Take 1 tablet (10 mg total) by mouth daily. -     lisinopril (ZESTRIL) 20 MG tablet; Take 1 tablet (20 mg total) by mouth daily. -     hydrochlorothiazide (HYDRODIURIL) 25 MG tablet; Take 1 tablet (25 mg total) by mouth  daily.  Need for Tdap vaccination -     Tdap vaccine greater than or equal to 7yo IM  Screening for diabetes mellitus -     HgB A1c  Class 1 obesity due to excess calories without serious comorbidity with  body mass index (BMI) of 33.0 to 33.9 in adult Obesity is 30-39 indicating an excess in caloric intake or underlining conditions. This may lead to other co-morbidities. Lifestyle modifications of diet and exercise may reduce obesity.     No orders of the defined types were placed in this encounter.   Follow-up: No follow-ups on file.    Grayce Sessions, NP

## 2020-09-21 NOTE — Patient Instructions (Addendum)
Influenza, Adult Influenza is also called "the flu." It is an infection in the lungs, nose, and throat (respiratory tract). It spreads easily from person to person (is contagious). The flu causes symptoms that are like a cold, along with high fever and body aches. What are the causes? This condition is caused by the influenza virus. You can get the virus by: Breathing in droplets that are in the air after a person infected with the flu coughed or sneezed. Touching something that has the virus on it and then touching your mouth, nose, or eyes. What increases the risk? Certain things may make you more likely to get the flu. These include: Not washing your hands often. Having close contact with many people during cold and flu season. Touching your mouth, eyes, or nose without first washing your hands. Not getting a flu shot every year. You may have a higher risk for the flu, and serious problems, such as a lung infection (pneumonia), if you: Are older than 65. Are pregnant. Have a weakened disease-fighting system (immune system) because of a disease or because you are taking certain medicines. Have a long-term (chronic) condition, such as: Heart, kidney, or lung disease. Diabetes. Asthma. Have a liver disorder. Are very overweight (morbidly obese). Have anemia. What are the signs or symptoms? Symptoms usually begin suddenly and last 4-14 days. They may include: Fever and chills. Headaches, body aches, or muscle aches. Sore throat. Cough. Runny or stuffy (congested) nose. Feeling discomfort in your chest. Not wanting to eat as much as normal. Feeling weak or tired. Feeling dizzy. Feeling sick to your stomach or throwing up. How is this treated? If the flu is found early, you can be treated with antiviral medicine. This can help to reduce how bad the illness is and how long it lasts. This may be given by mouth or through an IV tube. Taking care of yourself at home can help your  symptoms get better. Your doctor may want you to: Take over-the-counter medicines. Drink plenty of fluids. The flu often goes away on its own. If you have very bad symptoms or other problems, you may be treated in a hospital. Follow these instructions at home:   Activity Rest as needed. Get plenty of sleep. Stay home from work or school as told by your doctor. Do not leave home until you do not have a fever for 24 hours without taking medicine. Leave home only to go to your doctor. Eating and drinking Take an ORS (oral rehydration solution). This is a drink that is sold at pharmacies and stores. Drink enough fluid to keep your pee pale yellow. Drink clear fluids in small amounts as you are able. Clear fluids include: Water. Ice chips. Fruit juice mixed with water. Low-calorie sports drinks. Eat bland foods that are easy to digest. Eat small amounts as you are able. These foods include: Bananas. Applesauce. Rice. Lean meats. Toast. Crackers. Do not eat or drink: Fluids that have a lot of sugar or caffeine. Alcohol. Spicy or fatty foods. General instructions Take over-the-counter and prescription medicines only as told by your doctor. Use a cool mist humidifier to add moisture to the air in your home. This can make it easier for you to breathe. When using a cool mist humidifier, clean it daily. Empty water and replace with clean water. Cover your mouth and nose when you cough or sneeze. Wash your hands with soap and water often and for at least 20 seconds. This is also important after  you cough or sneeze. If you cannot use soap and water, use alcohol-based hand sanitizer. Keep all follow-up visits. How is this prevented?  Get a flu shot every year. You may get the flu shot in late summer, fall, or winter. Ask your doctor when you should get your flu shot. Avoid contact with people who are sick during fall and winter. This is cold and flu season. Contact a doctor if: You get  new symptoms. You have: Chest pain. Watery poop (diarrhea). A fever. Your cough gets worse. You start to have more mucus. You feel sick to your stomach. You throw up. Get help right away if you: Have shortness of breath. Have trouble breathing. Have skin or nails that turn a bluish color. Have very bad pain or stiffness in your neck. Get a sudden headache. Get sudden pain in your face or ear. Cannot eat or drink without throwing up. These symptoms may represent a serious problem that is an emergency. Get medical help right away. Call your local emergency services (911 in the [place] Do not wait to see if the symptoms will go away. Do not drive yourself to the hospital. Summary Influenza is also called "the flu." It is an infection in the lungs, nose, and throat. It spreads easily from person to person. Take over-the-counter and prescription medicines only as told by your doctor. Getting a flu shot every year is the best way to not get the flu. This information is not intended to replace advice given to you by your health care provider. Make sure you discuss any questions you have with your health care provider. Document Revised: 08/13/2019 Document Reviewed: 08/13/2019 Elsevier Patient Education  2022 [employer] Tdap (Tetanus, Diphtheria, Pertussis) Vaccine: What You Need to Know 1. Why get vaccinated? Tdap vaccine can prevent tetanus, diphtheria, and pertussis. Diphtheria and pertussis spread from person to person. Tetanus enters the body through cuts or wounds. TETANUS (T) causes painful stiffening of the muscles. Tetanus can lead to serious health problems, including being unable to open the mouth, having trouble swallowing and breathing, or death. DIPHTHERIA (D) can lead to difficulty breathing, heart failure, paralysis, or death. PERTUSSIS (aP), also known as "whooping cough," can cause uncontrollable, violent coughing that makes it hard to breathe, eat, or drink. Pertussis  can be extremely serious especially in babies and young children, causing pneumonia, convulsions, brain damage, or death. In teens and adults, it can cause weight loss, loss of bladder control, passing out, and rib fractures from severe coughing. 2. Tdap vaccine Tdap is only for children 7 years and older, adolescents, and adults.  Adolescents should receive a single dose of Tdap, preferably at age 41 or 12 years. Pregnant people should get a dose of Tdap during every pregnancy, preferably during the early part of the third trimester, to help protect the newborn from pertussis. Infants are most at risk for severe, life-threatening complications from pertussis. Adults who have never received Tdap should get a dose of Tdap. Also, adults should receive a booster dose of either Tdap or Td (a different vaccine that protects against tetanus and diphtheria but not pertussis) every 10 years, or after 5 years in the case of a severe or dirty wound or burn. Tdap may be given at the same time as other vaccines. 3. Talk with your health care provider Tell your vaccine provider if the person getting the vaccine: Has had an allergic reaction after a previous dose of any vaccine that protects against tetanus, diphtheria, or  pertussis, or has any severe, life-threatening allergies Has had a coma, decreased level of consciousness, or prolonged seizures within 7 days after a previous dose of any pertussis vaccine (DTP, DTaP, or Tdap) Has seizures or another nervous system problem Has ever had Guillain-Barr Syndrome (also called "GBS") Has had severe pain or swelling after a previous dose of any vaccine that protects against tetanus or diphtheria In some cases, your health care provider may decide to postpone Tdap vaccination until a future visit. People with minor illnesses, such as a cold, may be vaccinated. People who are moderately or severely ill should usually wait until they recover before getting Tdap vaccine.   Your health care provider can give you more information. 4. Risks of a vaccine reaction Pain, redness, or swelling where the shot was given, mild fever, headache, feeling tired, and nausea, vomiting, diarrhea, or stomachache sometimes happen after Tdap vaccination. People sometimes faint after medical procedures, including vaccination. Tell your provider if you feel dizzy or have vision changes or ringing in the ears.  As with any medicine, there is a very remote chance of a vaccine causing a severe allergic reaction, other serious injury, or death. 5. What if there is a serious problem? An allergic reaction could occur after the vaccinated person leaves the clinic. If you see signs of a severe allergic reaction (hives, swelling of the face and throat, difficulty breathing, a fast heartbeat, dizziness, or weakness), call 9-1-1 and get the person to the nearest hospital. For other signs that concern you, call your health care provider.  Adverse reactions should be reported to the Vaccine Adverse Event Reporting System (VAERS). Your health care provider will usually file this report, or you can do it yourself. Visit the VAERS website at www.vaers.LAgents.no or call 351-155-5239. VAERS is only for reporting reactions, and VAERS staff members do not give medical advice. 6. The National Vaccine Injury Compensation Program The [employer] Vaccine Injury Compensation Program (VICP) is a federal program that was created to compensate people who may have been injured by certain vaccines. Claims regarding alleged injury or death due to vaccination have a time limit for filing, which may be as short as two years. Visit the VICP website at SpiritualWord.at or call 613-542-8432 to learn about the program and about filing a claim. 7. How can I learn more? Ask your health care provider. Call your local or state health department. Visit the website of the Food and Drug Administration (FDA) for vaccine  package inserts and additional information at FinderList.no. Contact the Centers for Disease Control and Prevention (CDC): Call (401)167-6299 (1-800-CDC-INFO) or Visit CDC's website at PicCapture.uy. Vaccine Information Statement Tdap (Tetanus, Diphtheria, Pertussis) Vaccine (08/13/2019) This information is not intended to replace advice given to you by your health care provider. Make sure you discuss any questions you have with your health care provider. Document Revised: 09/08/2019 Document Reviewed: 09/08/2019 Elsevier Patient Education  2022 Elsevier Inc. Hypertension, Adult Hypertension is another name for high blood pressure. High blood pressure forces your heart to work harder to pump blood. This can cause problems over time. There are two numbers in a blood pressure reading. There is a top number (systolic) over a bottom number (diastolic). It is best to have a blood pressure that is below 120/80. Healthy choices can help lower your blood pressure, or you may need medicine to help lower it. What are the causes? The cause of this condition is not known. Some conditions may be related to high blood pressure. What  increases the risk? Smoking. Having type 2 diabetes mellitus, high cholesterol, or both. Not getting enough exercise or physical activity. Being overweight. Having too much fat, sugar, calories, or salt (sodium) in your diet. Drinking too much alcohol. Having long-term (chronic) kidney disease. Having a family history of high blood pressure. Age. Risk increases with age. Race. You may be at higher risk if you are African American. Gender. Men are at higher risk than women before age 33. After age 31, women are at higher risk than men. Having obstructive sleep apnea. Stress. What are the signs or symptoms? High blood pressure may not cause symptoms. Very high blood pressure (hypertensive crisis) may cause: Headache. Feelings of  worry or nervousness (anxiety). Shortness of breath. Nosebleed. A feeling of being sick to your stomach (nausea). Throwing up (vomiting). Changes in how you see. Very bad chest pain. Seizures. How is this treated? This condition is treated by making healthy lifestyle changes, such as: Eating healthy foods. Exercising more. Drinking less alcohol. Your health care provider may prescribe medicine if lifestyle changes are not enough to get your blood pressure under control, and if: Your top number is above 130. Your bottom number is above 80. Your personal target blood pressure may vary. Follow these instructions at home: Eating and drinking  If told, follow the DASH eating plan. To follow this plan: Fill one half of your plate at each meal with fruits and vegetables. Fill one fourth of your plate at each meal with whole grains. Whole grains include whole-wheat pasta, brown rice, and whole-grain bread. Eat or drink low-fat dairy products, such as skim milk or low-fat yogurt. Fill one fourth of your plate at each meal with low-fat (lean) proteins. Low-fat proteins include fish, chicken without skin, eggs, beans, and tofu. Avoid fatty meat, cured and processed meat, or chicken with skin. Avoid pre-made or processed food. Eat less than 1,500 mg of salt each day. Do not drink alcohol if: Your doctor tells you not to drink. You are pregnant, may be pregnant, or are planning to become pregnant. If you drink alcohol: Limit how much you use to: 0-1 drink a day for women. 0-2 drinks a day for men. Be aware of how much alcohol is in your drink. In the [place] one drink equals one 12 oz bottle of beer (355 mL), one 5 oz glass of wine (148 mL), or one 1 oz glass of hard liquor (44 mL). Lifestyle  Work with your doctor to stay at a healthy weight or to lose weight. Ask your doctor what the best weight is for you. Get at least 30 minutes of exercise most days of the week. This may include  walking, swimming, or biking. Get at least 30 minutes of exercise that strengthens your muscles (resistance exercise) at least 3 days a week. This may include lifting weights or doing Pilates. Do not use any products that contain nicotine or tobacco, such as cigarettes, e-cigarettes, and chewing tobacco. If you need help quitting, ask your doctor. Check your blood pressure at home as told by your doctor. Keep all follow-up visits as told by your doctor. This is important. Medicines Take over-the-counter and prescription medicines only as told by your doctor. Follow directions carefully. Do not skip doses of blood pressure medicine. The medicine does not work as well if you skip doses. Skipping doses also puts you at risk for problems. Ask your doctor about side effects or reactions to medicines that you should watch for. Contact a doctor  if you: Think you are having a reaction to the medicine you are taking. Have headaches that keep coming back (recurring). Feel dizzy. Have swelling in your ankles. Have trouble with your vision. Get help right away if you: Get a very bad headache. Start to feel mixed up (confused). Feel weak or numb. Feel faint. Have very bad pain in your: Chest. Belly (abdomen). Throw up more than once. Have trouble breathing. Summary Hypertension is another name for high blood pressure. High blood pressure forces your heart to work harder to pump blood. For most people, a normal blood pressure is less than 120/80. Making healthy choices can help lower blood pressure. If your blood pressure does not get lower with healthy choices, you may need to take medicine. This information is not intended to replace advice given to you by your health care provider. Make sure you discuss any questions you have with your health care provider. Document Revised: 09/03/2017 Document Reviewed: 09/03/2017 Elsevier Patient Education  2022 [employer].

## 2020-10-18 ENCOUNTER — Ambulatory Visit (INDEPENDENT_AMBULATORY_CARE_PROVIDER_SITE_OTHER): Payer: Self-pay | Admitting: Primary Care

## 2020-10-18 ENCOUNTER — Encounter (INDEPENDENT_AMBULATORY_CARE_PROVIDER_SITE_OTHER): Payer: Self-pay | Admitting: Primary Care

## 2022-06-17 ENCOUNTER — Encounter (HOSPITAL_COMMUNITY): Payer: Self-pay

## 2022-06-17 ENCOUNTER — Ambulatory Visit (HOSPITAL_COMMUNITY)
Admission: EM | Admit: 2022-06-17 | Discharge: 2022-06-17 | Disposition: A | Discharge status: Home / Self Care | Payer: BLUE CROSS/BLUE SHIELD | Attending: Family Medicine | Admitting: Family Medicine

## 2022-06-17 DIAGNOSIS — M25561 Pain in right knee: Secondary | ICD-10-CM

## 2022-06-17 DIAGNOSIS — R0789 Other chest pain: Secondary | ICD-10-CM

## 2022-06-17 MED ORDER — NAPROXEN 375 MG PO TABS: 375.0000 mg | ORAL_TABLET | Freq: Two times a day (BID) | ORAL | 0 refills | Status: DC | PRN

## 2022-06-17 NOTE — ED Triage Notes (Signed)
Patient reports that she has been having intermittent chest pain x 2-3 weeks, but for the past week when the chest pain occurs she has a slight SOB and slight nausea.   Patient also c/o right knee pain x 2-3 weeks and states she has been taking Tylenol 8 hour pills and the pain still wakes her up out of her sleep.

## 2022-06-17 NOTE — ED Provider Notes (Signed)
[facility]    CSN: [id number] Arrival date & time: 06/17/22  1349      History   Chief Complaint Chief Complaint  Patient presents with   Knee Pain   Chest Pain    HPI Stacie Gibson is a 47 y.o. female.   HPI Chest Wall Pain (right upper chest) Patient presents today complaining of chest pain.  On further evaluation patient is actually having a localized chest.  She has sports recently started a second job and is uncertain if she area which is painful is reproducible with touching the area. Right chest wall has no bruising or rash present. She has any associated dizziness, or shortness of breath.  On chart review patient has a history of hypertension, chest pain and anxiety.  Right Knee Pain  Patient presents for evaluation of right knee pain. Endorses recently working a second job that requires her to walk and stand on her feet for longer periods of time.  She endorses pain is exacerbated by walking and laying her leg flat. Pain is at times throbbing and sharp pain mid knee. She also endorses stiffening of knee when first awakens to walk in the mornings. No history of arthritis. Past Medical History:  Diagnosis Date   Anxiety    Chest pain    Hypertension    Pharyngitis    PTSD (post-traumatic stress disorder)    Thyromegaly     Patient Active Problem List   Diagnosis Date Noted   Iron deficiency anemia 03/02/2014   Symptomatic anemia 03/01/2014   Hypokalemia 03/01/2014   Chest pain 10/02/2010   Hypertension 10/02/2010   Thyromegaly 10/02/2010    History reviewed. No pertinent surgical history.  OB History   No obstetric history on file.      Home Medications    Prior to Admission medications   Medication Sig Start Date End Date Taking? Authorizing Provider  naproxen (NAPROSYN) 375 MG tablet Take 1 tablet (375 mg total) by mouth 2 (two) times daily as needed for moderate pain (knee and chest wall pain). 06/17/22  Yes Bing Neighbors, NP   amLODipine (NORVASC) 10 MG tablet Take 1 tablet (10 mg total) by mouth daily. 09/21/20   Grayce Sessions, NP  ferrous sulfate 325 (65 FE) MG tablet Take 1 tablet (325 mg total) by mouth daily with breakfast. 09/21/20   Grayce Sessions, NP  hydrochlorothiazide (HYDRODIURIL) 25 MG tablet Take 1 tablet (25 mg total) by mouth daily. 09/21/20   Grayce Sessions, NP  lisinopril (ZESTRIL) 20 MG tablet Take 1 tablet (20 mg total) by mouth daily. 09/21/20   Grayce Sessions, NP    Family History Family History  Problem Relation Age of Onset   Hypertension Mother     Social History Social History   Tobacco Use   Smoking status: Never   Smokeless tobacco: Never  Substance Use Topics   Alcohol use: No   Drug use: No     Allergies   Patient has no known allergies.   Review of Systems Review of Systems Pertinent negatives listed in HPI  Physical Exam Triage Vital Signs ED Triage Vitals [06/17/22 1418]  Enc Vitals Group     BP 135/83     Pulse Rate 74     Resp 16     Temp 98.8 F (37.1 C)     Temp Source Oral     SpO2 100 %     Weight  Height      Head Circumference      Peak Flow      Pain Score 7     Pain Loc      Pain Edu?      Excl. in GC?    No data found.  Updated Vital Signs BP 135/83 (BP Location: Left Arm)   Pulse 74   Temp 98.8 F (37.1 C) (Oral)   Resp 16   LMP 05/20/2022   SpO2 100%   Visual Acuity Right Eye Distance:   Left Eye Distance:   Bilateral Distance:    Right Eye Near:   Left Eye Near:    Bilateral Near:     Physical Exam Vitals reviewed.  Constitutional:      Appearance: She is well-developed.  HENT:     Head: Normocephalic and atraumatic.  Eyes:     Extraocular Movements: Extraocular movements intact.     Pupils: Pupils are equal, round, and reactive to light.  Cardiovascular:     Rate and Rhythm: Normal rate and regular rhythm.  Pulmonary:     Effort: Pulmonary effort is normal.  Chest:     Musculoskeletal:     Cervical back: Normal range of motion and neck supple.     Right knee: Bony tenderness present. No swelling. Normal range of motion. No tenderness. Normal patellar mobility.     Left knee: Normal. No swelling.     Comments: Patella joint tenderness   Skin:    General: Skin is warm and dry.  Neurological:     General: No focal deficit present.     Mental Status: She is alert.     UC Treatments / Results  Labs (all labs ordered are listed, but only abnormal results are displayed) Labs Reviewed - No data to display  EKG Normal Sinus Rhythm 74 BPM -No ST changes present  Radiology No results found.  Procedures Procedures (including critical care time)  Medications Ordered in UC Medications - No data to display  Initial Impression / Assessment and Plan / UC Course  I have reviewed the triage vital signs and the nursing notes.  Pertinent labs & imaging results that were available during my care of the patient were reviewed by me and considered in my medical decision making (see chart for details).   Acute right knee pain, patient recently began working a second job and suspect overuse has resulted due to new job and increased walking. Chest wall pain, ECG unremarkable and pain is localized on the right upper chest wall palpable tenderness elicited on exam.  Patient was unable to rule out direct chest wall trauma or pulling a muscle.  Advised to trial Naprosyn 375 twice daily for the next 7 to 10 days.  Continue especially Tylenol as needed intermittently for acute finding.  Also provided an Ace wrap for the encouraged to wear with weightbearing activities as this can help with pain management for laxity of the knee.  Patient advised to return whenever symptoms indicating chest pain that warrants evaluation in the setting of the emergency department.  Patient verbalized understanding and agreement with plan today. Final Clinical Impressions(s) / UC Diagnoses    Final diagnoses:  Acute pain of right knee  Chest wall discomfort     Discharge Instructions      Recommend wearing ACE wrap on right knee with all weightbearing activities. Chest pain is reproducible and localized, as discussed likely a muscle strain or you accident injured your chest wall. For  both knee and chest wall pain, naprosyn 375 mg twice daily for 7 -10 days then take as needed.  You may can take Tylenol for acute pain management. If you develop any severe chest pain any worse difficulty breathing and chest pain.  For any severe go immediately to the emergency department.     ED Prescriptions     Medication Sig Dispense Auth. Provider   naproxen (NAPROSYN) 375 MG tablet Take 1 tablet (375 mg total) by mouth 2 (two) times daily as needed for moderate pain (knee and chest wall pain). 30 tablet Bing Neighbors, NP      PDMP not reviewed this encounter.   Bing Neighbors, NP 06/17/22 [phone]

## 2022-06-17 NOTE — Discharge Instructions (Signed)
Recommend wearing ACE wrap on right knee with all weightbearing activities. Chest pain is reproducible and localized, as discussed likely a muscle strain or you accident injured your chest wall. For both knee and chest wall pain, naprosyn 375 mg twice daily for 7 -10 days then take as needed.  You may can take Tylenol for acute pain management. If you develop any severe chest pain any worse difficulty breathing and chest pain.  For any severe go immediately to the emergency department.

## 2022-08-16 ENCOUNTER — Encounter (HOSPITAL_COMMUNITY): Payer: Self-pay | Admitting: Emergency Medicine

## 2022-08-16 ENCOUNTER — Ambulatory Visit (HOSPITAL_COMMUNITY)
Admission: EM | Admit: 2022-08-16 | Discharge: 2022-08-16 | Disposition: A | Discharge status: Home / Self Care | Payer: BLUE CROSS/BLUE SHIELD | Source: Home / Self Care

## 2022-08-16 DIAGNOSIS — R61 Generalized hyperhidrosis: Secondary | ICD-10-CM | POA: Diagnosis not present

## 2022-08-16 DIAGNOSIS — R0789 Other chest pain: Secondary | ICD-10-CM | POA: Diagnosis not present

## 2022-08-16 LAB — COMPREHENSIVE METABOLIC PANEL
ALT: 14 U/L (ref 0–44)
AST: 21 U/L (ref 15–41)
Albumin: 3.6 g/dL (ref 3.5–5.0)
Alkaline Phosphatase: 27 U/L — ABNORMAL LOW (ref 38–126)
Anion gap: 10 (ref 5–15)
BUN: 7 mg/dL (ref 6–20)
CO2: 24 mmol/L (ref 22–32)
Calcium: 9.1 mg/dL (ref 8.9–10.3)
Chloride: 102 mmol/L (ref 98–111)
Creatinine, Ser: 0.69 mg/dL (ref 0.44–1.00)
GFR, Estimated: >60 mL/min (ref >60)
Glucose, Bld: 70 mg/dL (ref 70–99)
Potassium: 4 mmol/L (ref 3.5–5.1)
Sodium: 136 mmol/L (ref 135–145)
Total Bilirubin: 1.3 mg/dL — ABNORMAL HIGH (ref 0.3–1.2)
Total Protein: 7.6 g/dL (ref 6.5–8.1)

## 2022-08-16 LAB — T4, FREE: Free T4: 0.91 ng/dL (ref 0.61–1.12)

## 2022-08-16 LAB — CBC WITH DIFFERENTIAL/PLATELET
Abs Immature Granulocytes: 0.02 10*3/uL (ref 0.00–0.07)
Basophils Absolute: 0.1 10*3/uL (ref 0.0–0.1)
Basophils Relative: 1 %
Eosinophils Absolute: 0.1 10*3/uL (ref 0.0–0.5)
Eosinophils Relative: 1 %
HCT: 26 % — ABNORMAL LOW (ref 36.0–46.0)
Hemoglobin: 7 g/dL — ABNORMAL LOW (ref 12.0–15.0)
Immature Granulocytes: 0 %
Lymphocytes Relative: 30 %
Lymphs Abs: 1.7 10*3/uL (ref 0.7–4.0)
MCH: 17.5 pg — ABNORMAL LOW (ref 26.0–34.0)
MCHC: 26.9 g/dL — ABNORMAL LOW (ref 30.0–36.0)
MCV: 65 fL — ABNORMAL LOW (ref 80.0–100.0)
Monocytes Absolute: 0.3 10*3/uL (ref 0.1–1.0)
Monocytes Relative: 6 %
Neutro Abs: 3.3 10*3/uL (ref 1.7–7.7)
Neutrophils Relative %: 62 %
Platelets: 450 10*3/uL — ABNORMAL HIGH (ref 150–400)
RBC: 4 MIL/uL (ref 3.87–5.11)
RDW: 22.1 % — ABNORMAL HIGH (ref 11.5–15.5)
Smear Review: NORMAL
WBC: 5.5 10*3/uL (ref 4.0–10.5)
nRBC: 0 % (ref 0.0–0.2)

## 2022-08-16 LAB — TSH: TSH: 4.651 u[IU]/mL — ABNORMAL HIGH (ref 0.350–4.500)

## 2022-08-16 MED ORDER — BACLOFEN 5 MG PO TABS: 5.0000 mg | ORAL_TABLET | Freq: Two times a day (BID) | ORAL | 0 refills | Status: AC | PRN

## 2022-08-16 NOTE — ED Provider Notes (Signed)
[facility]    CSN: [id number] Arrival date & time: 08/16/22  1208      History   Chief Complaint Chief Complaint  Patient presents with   Chest Pain   Excessive Sweating    HPI Stacie Gibson is a 47 y.o. female.   Patient presents today with a 2-week history of intermittent left-sided chest pain.  She reports that this is worse in the morning and is not associated with any specific activity.  She reports episodes occur for several minutes then resolved without intervention.  She denies any associated shortness of breath, lightheadedness, weakness, nausea, vomiting.  She denies personal history of cardiovascular disease but does have a history in her mother beginning age 41.  She has a history of hypertension but denies history of diabetes, smoking, hyperlipidemia.  During episodes pain is rated 8 on a 0-10 pain scale, described as sharp/stabbing, no aggravating or alleviating factors identified.  She denies any heavy lifting or strenuous activity before symptoms began.  In addition reports associated excessive sweating.  She does report a history of thyromegaly but has never had hyper or hypothyroidism.  She does have a history of iron deficiency anemia that required transfusion in 2018/2019.  She denies any known blood loss.  She has not been through menopause and still has regular menstrual cycles that last approximately 5 days occurring every 28-30 days.  She denies any recent illness or additional symptoms including cough, congestion, fever, nausea, vomiting.  She is currently asymptomatic and not experiencing any chest pain.    Past Medical History:  Diagnosis Date   Anxiety    Chest pain    Hypertension    Pharyngitis    PTSD (post-traumatic stress disorder)    Thyromegaly     Patient Active Problem List   Diagnosis Date Noted   Iron deficiency anemia 03/02/2014   Symptomatic anemia 03/01/2014   Hypokalemia 03/01/2014   Chest pain 10/02/2010   Hypertension  10/02/2010   Thyromegaly 10/02/2010    History reviewed. No pertinent surgical history.  OB History   No obstetric history on file.      Home Medications    Prior to Admission medications   Medication Sig Start Date End Date Taking? Authorizing Provider  baclofen 5 MG TABS Take 1 tablet (5 mg total) by mouth 2 (two) times daily as needed for muscle spasms. 08/16/22  Yes Koleman Marling, Noberto Retort, PA-C    Family History Family History  Problem Relation Age of Onset   Hypertension Mother     Social History Social History   Tobacco Use   Smoking status: Never   Smokeless tobacco: Never  Substance Use Topics   Alcohol use: No   Drug use: No     Allergies   Patient has no known allergies.   Review of Systems Review of Systems  Constitutional:  Positive for activity change, diaphoresis and fatigue. Negative for appetite change and fever.  HENT:  Negative for congestion, sinus pressure, sneezing and sore throat.   Eyes:  Negative for visual disturbance.  Respiratory:  Negative for cough and shortness of breath.   Cardiovascular:  Positive for chest pain. Negative for palpitations and leg swelling.  Gastrointestinal:  Negative for abdominal pain, diarrhea, nausea and vomiting.  Neurological:  Negative for dizziness, light-headedness and headaches.     Physical Exam Triage Vital Signs ED Triage Vitals  Encounter Vitals Group     BP 08/16/22 1329 (!) 143/95     Systolic BP  Percentile --      Diastolic BP Percentile --      Pulse Rate 08/16/22 1329 73     Resp 08/16/22 1329 17     Temp 08/16/22 1329 98.5 F (36.9 C)     Temp Source 08/16/22 1329 Oral     SpO2 08/16/22 1329 99 %     Weight --      Height --      Head Circumference --      Peak Flow --      Pain Score 08/16/22 1327 8     Pain Loc --      Pain Education --      Exclude from Growth Chart --    No data found.  Updated Vital Signs BP (!) 143/95 (BP Location: Right Arm)   Pulse 73   Temp 98.5 F (36.9  C) (Oral)   Resp 17   LMP 08/07/2022   SpO2 99%   Visual Acuity Right Eye Distance:   Left Eye Distance:   Bilateral Distance:    Right Eye Near:   Left Eye Near:    Bilateral Near:     Physical Exam Vitals reviewed.  Constitutional:      General: She is awake. She is not in acute distress.    Appearance: Normal appearance. She is well-developed. She is not ill-appearing.     Comments: Very pleasant female appears stated age in no acute distress sitting comfortably in exam room   HENT:     Head: Normocephalic and atraumatic.  Cardiovascular:     Rate and Rhythm: Normal rate and regular rhythm.     Heart sounds: Normal heart sounds, S1 normal and S2 normal. No murmur heard. Pulmonary:     Effort: Pulmonary effort is normal.     Breath sounds: Normal breath sounds. No wheezing, rhonchi or rales.     Comments: Clear to auscultation bilaterally Chest:     Chest wall: Tenderness present. No deformity or swelling.     Comments: Tenderness palpation over left anterior chest wall.  Pain is reproducible on exam. Abdominal:     General: Bowel sounds are normal.     Palpations: Abdomen is soft.     Tenderness: There is no abdominal tenderness. There is no right CVA tenderness, left CVA tenderness, guarding or rebound.  Psychiatric:        Behavior: Behavior is cooperative.      UC Treatments / Results  Labs (all labs ordered are listed, but only abnormal results are displayed) Labs Reviewed  CBC WITH DIFFERENTIAL/PLATELET  COMPREHENSIVE METABOLIC PANEL  TSH  T4, FREE    EKG   Radiology No results found.  Procedures Procedures (including critical care time)  Medications Ordered in UC Medications - No data to display  Initial Impression / Assessment and Plan / UC Course  I have reviewed the triage vital signs and the nursing notes.  Pertinent labs & imaging results that were available during my care of the patient were reviewed by me and considered in my  medical decision making (see chart for details).     Patient is well-appearing, afebrile, nontoxic, nontachycardic.  EKG was obtained that showed normal sinus rhythm with ventricular rate of 70 bpm without ischemic changes.  Compared to 06/17/2022 tracing no significant change.  Pain is reproducible on exam.  Discussed symptoms are likely musculoskeletal in nature.  She was started on baclofen to help with pain with instruction not to drive or drink alcohol with  taking this medication as drowsiness is a common side effect.  She can use heat and gentle stretch for additional symptom relief.  She is to rest and drink plenty of fluid.  Given her family history of cardiovascular disease I did recommend she follow-up with a cardiologist.  We discussed that if she has any worsening or changing symptoms including recurrent chest pain you should go to the emergency room.  Given associated sweating/diaphoresis basic labs including CBC, CMP, TSH/free T4 obtained and are pending.  We will contact her if any of this is abnormal.  Recommend that she follow-up with her primary care soon as possible for additional evaluation.  Discussed that if she has any recurrent chest pain, associated shortness of breath, recurrent sweating, lightheadedness, weakness, nausea/vomiting she needs to go to the emergency room.  Strict return precautions given.  Work excuse note provided.  Final Clinical Impressions(s) / UC Diagnoses   Final diagnoses:  Chest wall pain  Atypical chest pain  Excessive sweating     Discharge Instructions      Because we are able to touch her chest to make your pain occur I am more concerned about musculoskeletal cause of your symptoms.  Please take baclofen up to twice a day.  I do recommend that you follow-up with cardiology given your family history of cardiovascular disease.  Call to schedule an appointment.  I also would like you to follow-up with your primary care as soon as possible as well.  I  will contact you if any of your lab work is abnormal.  If you have any worsening symptoms including severe pain, shortness of breath, heart racing, weakness, nausea/vomiting you need to go to the emergency room.     ED Prescriptions     Medication Sig Dispense Auth. Provider   baclofen 5 MG TABS Take 1 tablet (5 mg total) by mouth 2 (two) times daily as needed for muscle spasms. 14 tablet Amiley Shishido, Noberto Retort, PA-C      PDMP not reviewed this encounter.   Jeani Hawking, PA-C 08/16/22 1419

## 2022-08-16 NOTE — Discharge Instructions (Addendum)
Because we are able to touch her chest to make your pain occur I am more concerned about musculoskeletal cause of your symptoms.  Please take baclofen up to twice a day.  I do recommend that you follow-up with cardiology given your family history of cardiovascular disease.  Call to schedule an appointment.  I also would like you to follow-up with your primary care as soon as possible as well.  I will contact you if any of your lab work is abnormal.  If you have any worsening symptoms including severe pain, shortness of breath, heart racing, weakness, nausea/vomiting you need to go to the emergency room.

## 2022-08-16 NOTE — ED Triage Notes (Signed)
Pt reports that for over 2 weeks having left sided chest pain and sweating in the mornings. Chest pains last about 15 minutes.  Pt reports had 4 BMs today which is not normal. Took medication OTC for headache yesterday.

## 2022-08-23 ENCOUNTER — Encounter (HOSPITAL_COMMUNITY): Payer: Self-pay

## 2022-08-23 ENCOUNTER — Emergency Department (HOSPITAL_COMMUNITY)
Admission: EM | Admit: 2022-08-23 | Discharge: 2022-08-23 | Disposition: A | Discharge status: Home / Self Care | Payer: BLUE CROSS/BLUE SHIELD | Source: Home / Self Care | Attending: Emergency Medicine | Admitting: Emergency Medicine

## 2022-08-23 ENCOUNTER — Other Ambulatory Visit: Payer: Self-pay

## 2022-08-23 DIAGNOSIS — R5383 Other fatigue: Secondary | ICD-10-CM | POA: Diagnosis present

## 2022-08-23 DIAGNOSIS — D649 Anemia, unspecified: Secondary | ICD-10-CM | POA: Diagnosis not present

## 2022-08-23 LAB — CBC WITH DIFFERENTIAL/PLATELET
Abs Immature Granulocytes: 0.01 10*3/uL (ref 0.00–0.07)
Basophils Absolute: 0.1 10*3/uL (ref 0.0–0.1)
Basophils Relative: 1 %
Eosinophils Absolute: 0.1 10*3/uL (ref 0.0–0.5)
Eosinophils Relative: 2 %
HCT: 24.3 % — ABNORMAL LOW (ref 36.0–46.0)
Hemoglobin: 6.5 g/dL — CL (ref 12.0–15.0)
Immature Granulocytes: 0 %
Lymphocytes Relative: 32 %
Lymphs Abs: 1.6 10*3/uL (ref 0.7–4.0)
MCH: 17.6 pg — ABNORMAL LOW (ref 26.0–34.0)
MCHC: 26.7 g/dL — ABNORMAL LOW (ref 30.0–36.0)
MCV: 65.9 fL — ABNORMAL LOW (ref 80.0–100.0)
Monocytes Absolute: 0.3 10*3/uL (ref 0.1–1.0)
Monocytes Relative: 6 %
Neutro Abs: 2.9 10*3/uL (ref 1.7–7.7)
Neutrophils Relative %: 59 %
Platelets: 349 10*3/uL (ref 150–400)
RBC: 3.69 MIL/uL — ABNORMAL LOW (ref 3.87–5.11)
RDW: 21.9 % — ABNORMAL HIGH (ref 11.5–15.5)
WBC: 5.1 10*3/uL (ref 4.0–10.5)
nRBC: 0 % (ref 0.0–0.2)

## 2022-08-23 LAB — PREPARE RBC (CROSSMATCH)

## 2022-08-23 LAB — BASIC METABOLIC PANEL
Anion gap: 6 (ref 5–15)
BUN: 12 mg/dL (ref 6–20)
CO2: 22 mmol/L (ref 22–32)
Calcium: 8.6 mg/dL — ABNORMAL LOW (ref 8.9–10.3)
Chloride: 108 mmol/L (ref 98–111)
Creatinine, Ser: 0.68 mg/dL (ref 0.44–1.00)
GFR, Estimated: >60 mL/min (ref >60)
Glucose, Bld: 97 mg/dL (ref 70–99)
Potassium: 4 mmol/L (ref 3.5–5.1)
Sodium: 136 mmol/L (ref 135–145)

## 2022-08-23 MED ORDER — NEPHRO-VITE RX 1 MG PO TABS: 1.0000 | ORAL_TABLET | Freq: Every day | ORAL | 0 refills | Status: AC

## 2022-08-23 MED ORDER — SODIUM CHLORIDE 0.9% IV SOLUTION: Freq: Once | INTRAVENOUS | Status: AC

## 2022-08-23 NOTE — ED Triage Notes (Signed)
Patient presented to ER with low hgb. She states last Friday she went to urgent care for chest pain and dizzy spells. They called to tell her to go to ER for labs drawn at urgent care. HGB 7 at urgent care last Friday. Patient denies bleeding but stated she has needed blood transfusions in the past.

## 2022-08-23 NOTE — ED Provider Notes (Signed)
[place] [facility] AT [facility] Provider Note   CSN: [id number] Arrival date & time: 08/23/22  1246     History  Chief Complaint  Patient presents with   Abnormal Lab    Stacie Gibson is a 47 y.o. female.  HPI Presents with weakness, fatigue, worsening with activity.  Patient states that she is generally well, last menstrual period 2 weeks ago.  1 week ago with these concerns she saw her physician, had blood work performed, and the next day was notified that her hemoglobin was critically abnormal.  She states that she had to complete work this week, but now that she is completed a work week she presents for evaluation.  She notes ongoing fatigue, dyspnea, decreased exercise capacity, but no focal pain, no abdominal pain, no melena, hematemesis.    Home Medications Prior to Admission medications   Medication Sig Start Date End Date Taking? Authorizing Provider  baclofen 5 MG TABS Take 1 tablet (5 mg total) by mouth 2 (two) times daily as needed for muscle spasms. 08/16/22   Raspet, Noberto Retort, PA-C      Allergies    Patient has no known allergies.    Review of Systems   Review of Systems  All other systems reviewed and are negative.   Physical Exam Updated Vital Signs BP 133/77 (BP Location: Left Arm)   Pulse 80   Temp 98.8 F (37.1 C) (Oral)   Resp 18   Ht 5\' 8"  (1.727 m)   Wt 90.7 kg   LMP 08/07/2022   SpO2 100%   BMI 30.41 kg/m  Physical Exam Vitals and nursing note reviewed.  Constitutional:      General: She is not in acute distress.    Appearance: She is well-developed.  HENT:     Head: Normocephalic and atraumatic.  Eyes:     Conjunctiva/sclera: Conjunctivae normal.  Cardiovascular:     Rate and Rhythm: Normal rate and regular rhythm.  Pulmonary:     Effort: Pulmonary effort is normal. No respiratory distress.     Breath sounds: Normal breath sounds. No stridor.  Abdominal:     General: There is no distension.  Skin:     General: Skin is warm and dry.  Neurological:     Mental Status: She is alert and oriented to person, place, and time.     Cranial Nerves: No cranial nerve deficit.  Psychiatric:        Mood and Affect: Mood normal.     ED Results / Procedures / Treatments   Labs (all labs ordered are listed, but only abnormal results are displayed) Labs Reviewed  BASIC METABOLIC PANEL - Abnormal; Notable for the following components:      Result Value   Calcium 8.6 (*)    All other components within normal limits  CBC WITH DIFFERENTIAL/PLATELET - Abnormal; Notable for the following components:   RBC 3.69 (*)    Hemoglobin 6.5 (*)    HCT 24.3 (*)    MCV 65.9 (*)    MCH 17.6 (*)    MCHC 26.7 (*)    RDW 21.9 (*)    All other components within normal limits  PREPARE RBC (CROSSMATCH)  TYPE AND SCREEN    EKG None  Radiology No results found.  Procedures Procedures    Medications Ordered in ED Medications  0.9 %  sodium chloride infusion (Manually program via Guardrails IV Fluids) (has no administration in time range)    ED  Course/ Medical Decision Making/ A&P                                 Medical Decision Making Adult female with 2 prior blood transfusions which a few years ago now presents with fatigue, decreased exercise capacity, but no pain, no lightheadedness.  Patient is awake, alert, speaking clearly, suspicion for occult GI bleed versus symptomatic anemia secondary to menstrual cycles.  Former less likely given patient's denial of any abnormal coloring of her stool, absence of blood thinners. Patient's labs performed, reviewed, notable for anemia, 6.5 hemoglobin, after discussion of the risks benefits of transfusion patient consented for this.  Patient's other labs unremarkable and other was suggestive of microcytic anemia. Patient will receive a blood transfusion, should this be tolerated without complication, she is likely appropriate for discharge with multivitamin therapy,  primary care follow-up.  Amount and/or Complexity of Data Reviewed Labs: ordered. Decision-making details documented in ED Course.  Risk Prescription drug management. Decision regarding hospitalization.  Critical Care Total time providing critical care: 35 minutes  Final Clinical Impression(s) / ED Diagnoses Final diagnoses:  Symptomatic anemia     Gerhard Munch, MD 08/23/22 1554

## 2022-08-23 NOTE — ED Notes (Signed)
Blood bank has blood ready for this patient. Chad,RN notified.

## 2022-08-23 NOTE — Discharge Instructions (Signed)
Please be sure to follow-up with your physician to discuss today's presentation for anemia.  Return here for concerning changes in your condition.

## 2022-08-24 LAB — TYPE AND SCREEN
ABO/RH(D): O POS
Antibody Screen: NEGATIVE
Unit division: 0
Unit division: 0

## 2022-08-24 LAB — BPAM RBC
Blood Product Expiration Date: 202409152359
Blood Product Expiration Date: 202409152359
ISSUE DATE / TIME: 202408161605
ISSUE DATE / TIME: 202408161930
Unit Type and Rh: 5100
Unit Type and Rh: 5100

## 2022-11-12 ENCOUNTER — Ambulatory Visit: Payer: BLUE CROSS/BLUE SHIELD | Attending: Cardiovascular Disease | Admitting: Cardiovascular Disease

## 2022-11-13 ENCOUNTER — Encounter: Payer: Self-pay | Admitting: Physician Assistant

## 2023-05-13 ENCOUNTER — Other Ambulatory Visit: Payer: Self-pay

## 2023-05-13 ENCOUNTER — Encounter (HOSPITAL_COMMUNITY): Payer: Self-pay | Admitting: Emergency Medicine

## 2023-05-13 ENCOUNTER — Emergency Department (HOSPITAL_COMMUNITY): Payer: MEDICAID

## 2023-05-13 ENCOUNTER — Emergency Department (HOSPITAL_COMMUNITY)
Admission: EM | Admit: 2023-05-13 | Discharge: 2023-05-13 | Disposition: A | Discharge status: Home / Self Care | Payer: MEDICAID

## 2023-05-13 DIAGNOSIS — D649 Anemia, unspecified: Secondary | ICD-10-CM | POA: Insufficient documentation

## 2023-05-13 DIAGNOSIS — I10 Essential (primary) hypertension: Secondary | ICD-10-CM | POA: Diagnosis not present

## 2023-05-13 DIAGNOSIS — R0789 Other chest pain: Secondary | ICD-10-CM | POA: Insufficient documentation

## 2023-05-13 DIAGNOSIS — Z79899 Other long term (current) drug therapy: Secondary | ICD-10-CM | POA: Insufficient documentation

## 2023-05-13 DIAGNOSIS — Z3202 Encounter for pregnancy test, result negative: Secondary | ICD-10-CM | POA: Diagnosis not present

## 2023-05-13 DIAGNOSIS — R079 Chest pain, unspecified: Secondary | ICD-10-CM | POA: Diagnosis present

## 2023-05-13 LAB — CBC
HCT: 28.6 % — ABNORMAL LOW (ref 36.0–46.0)
Hemoglobin: 8.1 g/dL — ABNORMAL LOW (ref 12.0–15.0)
MCH: 19.1 pg — ABNORMAL LOW (ref 26.0–34.0)
MCHC: 28.3 g/dL — ABNORMAL LOW (ref 30.0–36.0)
MCV: 67.6 fL — ABNORMAL LOW (ref 80.0–100.0)
Platelets: 371 10*3/uL (ref 150–400)
RBC: 4.23 MIL/uL (ref 3.87–5.11)
RDW: 19.9 % — ABNORMAL HIGH (ref 11.5–15.5)
WBC: 5.3 10*3/uL (ref 4.0–10.5)
nRBC: 0 % (ref 0.0–0.2)

## 2023-05-13 LAB — BASIC METABOLIC PANEL WITH GFR
Anion gap: 11 (ref 5–15)
BUN: 8 mg/dL (ref 6–20)
CO2: 22 mmol/L (ref 22–32)
Calcium: 8.9 mg/dL (ref 8.9–10.3)
Chloride: 104 mmol/L (ref 98–111)
Creatinine, Ser: 0.65 mg/dL (ref 0.44–1.00)
GFR, Estimated: >60 mL/min (ref >60)
Glucose, Bld: 83 mg/dL (ref 70–99)
Potassium: 3.6 mmol/L (ref 3.5–5.1)
Sodium: 137 mmol/L (ref 135–145)

## 2023-05-13 LAB — TROPONIN I (HIGH SENSITIVITY): Troponin I (High Sensitivity): <2 ng/L (ref <18)

## 2023-05-13 LAB — HCG, SERUM, QUALITATIVE: Preg, Serum: NEGATIVE

## 2023-05-13 NOTE — ED Triage Notes (Signed)
 Pt endorse left sided chest, shoulder and arm pressure for a week. Pt not taking her HTN meds or fluid pills.

## 2023-05-13 NOTE — ED Provider Notes (Signed)
 [place] [facility]MENT AT [facility] Provider Note   CSN: [id number] Arrival date & time: 05/13/23  1116     History  Chief Complaint  Patient presents with   Chest Pain    Stacie Gibson is a 48 y.o. female.  48 year old female presents today for concern of left-sided chest/shoulder pain that has been ongoing for about 1-2 weeks.  Currently without any chest pain.  Denies any shortness of breath.  No other current symptoms.  Currently does not have a PCP.  No prior history of CAD.  Does have history of anemia.  Denies any recent bleeding.  The history is provided by the patient. No language interpreter was used.       Home Medications Prior to Admission medications   Medication Sig Start Date End Date Taking? Authorizing Provider  B Complex-C-Folic Acid (B COMPLEX-VITAMIN C-FOLIC ACID) 1 MG tablet Take 1 tablet by mouth daily with breakfast. 08/23/22   Dorenda Gandy, MD  baclofen  5 MG TABS Take 1 tablet (5 mg total) by mouth 2 (two) times daily as needed for muscle spasms. 08/16/22   Raspet, Erin K, PA-C      Allergies    Patient has no known allergies.    Review of Systems   Review of Systems  Constitutional:  Negative for chills and fever.  Respiratory:  Negative for shortness of breath.   Cardiovascular:  Positive for chest pain (now resolved).  Gastrointestinal:  Negative for abdominal pain, nausea and vomiting.  Neurological:  Negative for light-headedness.  All other systems reviewed and are negative.   Physical Exam Updated Vital Signs BP (!) 156/101 (BP Location: Right Arm)   Pulse 76   Temp 98.3 F (36.8 C)   Resp 16   Ht 5\' 8"  (1.727 m)   Wt 91 kg   LMP 05/03/2023   SpO2 98%   BMI 30.50 kg/m  Physical Exam Vitals and nursing note reviewed.  Constitutional:      General: She is not in acute distress.    Appearance: Normal appearance. She is not ill-appearing.  HENT:     Head: Normocephalic and atraumatic.     Nose: Nose  normal.  Eyes:     Conjunctiva/sclera: Conjunctivae normal.  Cardiovascular:     Rate and Rhythm: Normal rate and regular rhythm.  Pulmonary:     Effort: Pulmonary effort is normal. No respiratory distress.     Breath sounds: Normal breath sounds. No wheezing.  Abdominal:     Palpations: Abdomen is soft.  Musculoskeletal:        General: No deformity. Normal range of motion.     Cervical back: Normal range of motion.  Skin:    Findings: No rash.  Neurological:     Mental Status: She is alert.     ED Results / Procedures / Treatments   Labs (all labs ordered are listed, but only abnormal results are displayed) Labs Reviewed  CBC - Abnormal; Notable for the following components:      Result Value   Hemoglobin 8.1 (*)    HCT 28.6 (*)    MCV 67.6 (*)    MCH 19.1 (*)    MCHC 28.3 (*)    RDW 19.9 (*)    All other components within normal limits  BASIC METABOLIC PANEL WITH GFR  HCG, SERUM, QUALITATIVE  TROPONIN I (HIGH SENSITIVITY)    EKG EKG Interpretation Date/Time:  Tuesday May 13 2023 11:22:47 EDT Ventricular Rate:  74 PR  Interval:  174 QRS Duration:  96 QT Interval:  384 QTC Calculation: 426 R Axis:   -19  Text Interpretation: Normal sinus rhythm Incomplete right bundle branch block Borderline ECG When compared with ECG of 16-Aug-2022 13:34, PREVIOUS ECG IS PRESENT Confirmed by Elise Guile [phone]) on 05/13/2023 12:34:17 PM  Radiology DG Chest 2 View Result Date: 05/13/2023 CLINICAL DATA:  Chest pain. EXAM: CHEST - 2 VIEW COMPARISON:  August 03, 2020. FINDINGS: The heart size and mediastinal contours are within normal limits. Both lungs are clear. The visualized skeletal structures are unremarkable. IMPRESSION: No active cardiopulmonary disease. Electronically Signed   By: Rosalene Colon M.D.   On: 05/13/2023 12:16    Procedures Procedures    Medications Ordered in ED Medications - No data to display  ED Course/ Medical Decision Making/ A&P                                  Medical Decision Making Amount and/or Complexity of Data Reviewed Labs: ordered. Radiology: ordered.   Medical Decision Making / ED Course   This patient presents to the ED for concern of chest pain, this involves an extensive number of treatment options, and is a complaint that carries with it a high risk of complications and morbidity.  The differential diagnosis includes ACS, PE, pneumonia, MSK pain, GERD  MDM: 48 year old female presents today for left-sided pain involving her left chest wall, left shoulder.  No previous history of CAD.  Low heart score.  Currently chest pain-free. Not taking her home medicines however has not taken these now for 2 years.  Does not have a PCP. Given a referral.  She states she would like to hold off until she establishes with PCP to resume her medicines.  Strict return precautions discussed. CBC shows hemoglobin of 8.1 which is above her baseline.  No evidence of recent bleeding.  CBC unremarkable.  Troponin undetectable.  BMP unremarkable.  Pregnancy test negative.  Chest x-ray without acute cardiopulmonary process.  EKG without acute ischemic change.  Patient discharged in stable condition.  Return precaution discussed.   Additional history obtained: -Additional history obtained from previous ED visits -External records from outside source obtained and reviewed including: Chart review including previous notes, labs, imaging, consultation notes   Lab Tests: -I ordered, reviewed, and interpreted labs.   The pertinent results include:   Labs Reviewed  CBC - Abnormal; Notable for the following components:      Result Value   Hemoglobin 8.1 (*)    HCT 28.6 (*)    MCV 67.6 (*)    MCH 19.1 (*)    MCHC 28.3 (*)    RDW 19.9 (*)    All other components within normal limits  BASIC METABOLIC PANEL WITH GFR  HCG, SERUM, QUALITATIVE  TROPONIN I (HIGH SENSITIVITY)      EKG  EKG Interpretation Date/Time:  Tuesday May 13 2023  11:22:47 EDT Ventricular Rate:  74 PR Interval:  174 QRS Duration:  96 QT Interval:  384 QTC Calculation: 426 R Axis:   -19  Text Interpretation: Normal sinus rhythm Incomplete right bundle branch block Borderline ECG When compared with ECG of 16-Aug-2022 13:34, PREVIOUS ECG IS PRESENT Confirmed by Elise Guile [phone]) on 05/13/2023 12:34:17 PM         Imaging Studies ordered: I ordered imaging studies including chest x-ray I independently visualized and interpreted imaging. I agree with the radiologist  interpretation   Medicines ordered and prescription drug management: No orders of the defined types were placed in this encounter.   -I have reviewed the patients home medicines and have made adjustments as needed    Social Determinants of Health:  Factors impacting patients care include: No established PCP   Reevaluation: After the interventions noted above, I reevaluated the patient and found that they have :improved  Co morbidities that complicate the patient evaluation  Past Medical History:  Diagnosis Date   Anxiety    Chest pain    Excessive sweating    Hypertension    Hypokalemia    Iron  deficiency anemia    Pharyngitis    PTSD (post-traumatic stress disorder)    Symptomatic anemia    Thyromegaly       Dispostion: Discharged in stable condition.  Return precaution discussed.  Patient voices understanding and is in agreement with plan.   Final Clinical Impression(s) / ED Diagnoses Final diagnoses:  Atypical chest pain    Rx / DC Orders ED Discharge Orders     None         Lucina Sabal, PA-C 05/13/23 1247    Rolinda Climes, DO 05/13/23 1550

## 2023-05-13 NOTE — Discharge Instructions (Signed)
 Your workup today was reassuring.  Follow-up with your primary care doctor.  If you do not have 1 I have listed a clinic that you can establish care with.  Your blood work and chest x-ray did not show any concerning findings.  No evidence of heart attack today.

## 2023-05-14 ENCOUNTER — Encounter (HOSPITAL_COMMUNITY): Payer: Self-pay

## 2023-05-21 ENCOUNTER — Ambulatory Visit (INDEPENDENT_AMBULATORY_CARE_PROVIDER_SITE_OTHER): Payer: MEDICAID | Admitting: Family

## 2023-05-21 ENCOUNTER — Encounter: Payer: Self-pay | Admitting: Family

## 2023-05-21 VITALS — BP 128/89 | HR 87 | Temp 99.8°F | Resp 16 | Ht 68.0 in | Wt 231.4 lb

## 2023-05-21 DIAGNOSIS — Z7689 Persons encountering health services in other specified circumstances: Secondary | ICD-10-CM

## 2023-05-21 DIAGNOSIS — R9431 Abnormal electrocardiogram [ECG] [EKG]: Secondary | ICD-10-CM | POA: Diagnosis not present

## 2023-05-21 DIAGNOSIS — R229 Localized swelling, mass and lump, unspecified: Secondary | ICD-10-CM | POA: Diagnosis not present

## 2023-05-21 DIAGNOSIS — R52 Pain, unspecified: Secondary | ICD-10-CM | POA: Diagnosis not present

## 2023-05-21 DIAGNOSIS — R0789 Other chest pain: Secondary | ICD-10-CM

## 2023-05-21 NOTE — Progress Notes (Signed)
 Subjective:    Stacie Gibson - 48 y.o. female MRN [id number]  Date of birth: 07-20-1975  HPI  Stacie Gibson is to establish care and Emergency Department follow-up.  Current issues and/or concerns: 05/13/2023 [facility] Emergency Department at [facility] per DO note:    ED Course/ Medical Decision Making/ A&P                               Medical Decision Making Amount and/or Complexity of Data Reviewed Labs: ordered. Radiology: ordered.     Medical Decision Making / ED Course     This patient presents to the ED for concern of chest pain, this involves an extensive number of treatment options, and is a complaint that carries with it a high risk of complications and morbidity.  The differential diagnosis includes ACS, PE, pneumonia, MSK pain, GERD   MDM: 48 year old female presents today for left-sided pain involving her left chest wall, left shoulder.  No previous history of CAD.  Low heart score.  Currently chest pain-free. Not taking her home medicines however has not taken these now for 2 years.  Does not have a PCP. Given a referral.  She states she would like to hold off until she establishes with PCP to resume her medicines.  Strict return precautions discussed. CBC shows hemoglobin of 8.1 which is above her baseline.  No evidence of recent bleeding.  CBC unremarkable.  Troponin undetectable.  BMP unremarkable.  Pregnancy test negative.  Chest x-ray without acute cardiopulmonary process.  EKG without acute ischemic change.   Patient discharged in stable condition.  Return precaution discussed.     Additional history obtained: -Additional history obtained from previous ED visits -External records from outside source obtained and reviewed including: Chart review including previous notes, labs, imaging, consultation notes     Lab Tests: -I ordered, reviewed, and interpreted labs.   The pertinent results include:        Labs Reviewed  CBC - Abnormal; Notable  for the following components:      Result Value     Hemoglobin 8.1 (*)      HCT 28.6 (*)      MCV 67.6 (*)      MCH 19.1 (*)      MCHC 28.3 (*)      RDW 19.9 (*)      All other components within normal limits  BASIC METABOLIC PANEL WITH GFR  HCG, SERUM, QUALITATIVE  TROPONIN I (HIGH SENSITIVITY)        EKG   EKG Interpretation Date/Time:           Tuesday May 13 2023 11:22:47 EDT Ventricular Rate:   74 PR Interval:                174 QRS Duration:              96 QT Interval:                384 QTC Calculation:426 R Axis:             -19   Text Interpretation:Normal sinus rhythm Incomplete right bundle branch block Borderline ECG When compared with ECG of 16-Aug-2022 13:34, PREVIOUS ECG IS PRESENT Confirmed by Elise Guile [phone]) on 05/13/2023 12:34:17 PM               Imaging Studies ordered: I ordered imaging studies including chest x-ray I  independently visualized and interpreted imaging. I agree with the radiologist interpretation     Medicines ordered and prescription drug management: No orders of the defined types were placed in this encounter.   -I have reviewed the patients home medicines and have made adjustments as needed       Social Determinants of Health:  Factors impacting patients care include: No established PCP     Reevaluation: After the interventions noted above, I reevaluated the patient and found that they have :improved   Co morbidities that complicate the patient evaluation      Past Medical History:  Diagnosis Date   Anxiety     Chest pain     Excessive sweating     Hypertension     Hypokalemia     Iron  deficiency anemia     Pharyngitis     PTSD (post-traumatic stress disorder)     Symptomatic anemia     Thyromegaly    Dispostion: Discharged in stable condition.  Return precaution discussed.  Patient voices understanding and is in agreement with plan.  Follow-Ups: Schedule an appointment with [facility]   Today's office visit 05/21/2023: - Reports feeling improved since Emergency Department discharge. Denies red flag symptoms.  - States her bones have been hurting for several weeks. Denies recent trauma/injury and red flag symptoms.  - Lump of right lower extremity. Denies red flag symptoms.  - No further issues/concerns for discussion today.   ROS per HPI    Health Maintenance:  Health Maintenance Due  Topic Date Due   Hepatitis C Screening  Never done   Cervical Cancer Screening (HPV/Pap Cotest)  Never done   Colonoscopy  Never done     Past Medical History: Patient Active Problem List   Diagnosis Date Noted   Iron  deficiency anemia 03/02/2014   Symptomatic anemia 03/01/2014   Hypokalemia 03/01/2014   Chest pain 10/02/2010   Hypertension 10/02/2010   Thyromegaly 10/02/2010      Social History   reports that she has never smoked. She has never used smokeless tobacco. She reports that she does not drink alcohol and does not use drugs.   Family History  family history includes Hypertension in her mother.   Medications: reviewed and updated   Objective:   Physical Exam BP 128/89   Pulse 87   Temp 99.8 F (37.7 C) (Oral)   Resp 16   Ht 5\' 8"  (1.727 m)   Wt 231 lb 6.4 oz (105 kg)   LMP 05/03/2023 (Exact Date)   SpO2 94%   BMI 35.18 kg/m   Physical Exam HENT:     Head: Normocephalic and atraumatic.     Nose: Nose normal.     Mouth/Throat:     Mouth: Mucous membranes are moist.     Pharynx: Oropharynx is clear.  Eyes:     Extraocular Movements: Extraocular movements intact.     Conjunctiva/sclera: Conjunctivae normal.     Pupils: Pupils are equal, round, and reactive to light.  Cardiovascular:     Rate and Rhythm: Normal rate and regular rhythm.     Pulses: Normal pulses.     Heart sounds: Normal heart sounds.  Pulmonary:     Effort: Pulmonary effort is normal.     Breath sounds: Normal breath sounds.  Musculoskeletal:        General: Normal  range of motion.     Right shoulder: Normal.     Left shoulder: Normal.  Right upper arm: Normal.     Left upper arm: Normal.     Right elbow: Normal.     Left elbow: Normal.     Right forearm: Normal.     Left forearm: Normal.     Right wrist: Normal.     Left wrist: Normal.     Right hand: Normal.     Left hand: Normal.     Cervical back: Normal, normal range of motion and neck supple.     Thoracic back: Normal.     Lumbar back: Normal.     Right hip: Normal.     Left hip: Normal.     Right upper leg: Normal.     Left upper leg: Normal.     Right knee: Normal.     Left knee: Normal.     Right lower leg: Normal.     Left lower leg: Normal.     Right ankle: Normal.     Left ankle: Normal.     Right foot: Normal.     Left foot: Normal.  Skin:    General: Skin is warm and dry.     Comments: Soft movable lump right lower extremity, no drainage.  Neurological:     General: No focal deficit present.     Mental Status: She is alert and oriented to person, place, and time.  Psychiatric:        Mood and Affect: Mood normal.        Behavior: Behavior normal.      Assessment & Plan:  1. Encounter to establish care (Primary) - Patient presents today to establish care. During the interim follow-up with primary provider as scheduled.  - Return for annual physical examination, labs, and health maintenance. Arrive fasting meaning having no food for at least 8 hours prior to appointment. You may have only water or black coffee. Please take scheduled medications as normal.  2. Atypical chest pain 3. Abnormal EKG - Patient today in office with no cardiopulmonary/acute distress.  - Referral to Cardiology for evaluation/management. - Ambulatory referral to Cardiology  4. Whole body pain - Routine screening.  - Rheumatoid factor - Sedimentation Rate - ANA - Vitamin D, 25-hydroxy  5. Lumps on the skin - Referral to Dermatology for evaluation/management. - Ambulatory referral  to Dermatology   Patient was given clear instructions to go to Emergency Department or return to medical center if symptoms don't improve, worsen, or new problems develop.The patient verbalized understanding.  I discussed the assessment and treatment plan with the patient. The patient was provided an opportunity to ask questions and all were answered. The patient agreed with the plan and demonstrated an understanding of the instructions.   The patient was advised to call back or seek an in-person evaluation if the symptoms worsen or if the condition fails to improve as anticipated.    Lavona Pounds, NP 05/21/2023, 2:56 PM Primary Care at [facility]

## 2023-05-21 NOTE — Progress Notes (Signed)
 Bones be aching bad, know on bottom of right leg,

## 2023-05-22 ENCOUNTER — Ambulatory Visit: Payer: Self-pay | Admitting: Family

## 2023-05-22 DIAGNOSIS — R52 Pain, unspecified: Secondary | ICD-10-CM

## 2023-05-22 DIAGNOSIS — E559 Vitamin D deficiency, unspecified: Secondary | ICD-10-CM

## 2023-05-22 LAB — RHEUMATOID FACTOR: Rheumatoid fact SerPl-aCnc: <10 [IU]/mL (ref <14.0)

## 2023-05-22 LAB — ANA: Anti Nuclear Antibody (ANA): NEGATIVE

## 2023-05-22 LAB — SEDIMENTATION RATE: Sed Rate: 45 mm/h — ABNORMAL HIGH (ref 0–32)

## 2023-05-22 LAB — VITAMIN D 25 HYDROXY (VIT D DEFICIENCY, FRACTURES): Vit D, 25-Hydroxy: 7.3 ng/mL — ABNORMAL LOW (ref 30.0–100.0)

## 2023-05-22 MED ORDER — VITAMIN D (ERGOCALCIFEROL) 1.25 MG (50000 UNIT) PO CAPS: 50000.0000 [IU] | ORAL_CAPSULE | ORAL | 0 refills | Status: AC

## 2023-05-29 ENCOUNTER — Telehealth: Payer: Self-pay | Admitting: *Deleted

## 2023-05-29 NOTE — Telephone Encounter (Signed)
 Patient called in on why she got a referral for dermatology. I explained to her what the reason was (lump on leg) and gave her the information for that referral to call again and make appointment and also gave her information for cardiology referral

## 2023-05-29 NOTE — Telephone Encounter (Signed)
 Pt called and LMOM. Pt wondering if she could be set up with the coumadin clinic. Her PCP is Amy Rogerio Clay.   Attempted to call pt back- LMOM to call the clinic back. Pt does not have a cardiologist at this practice.

## 2023-05-30 ENCOUNTER — Encounter: Payer: Self-pay | Admitting: Physician Assistant

## 2023-05-30 ENCOUNTER — Ambulatory Visit (INDEPENDENT_AMBULATORY_CARE_PROVIDER_SITE_OTHER): Payer: MEDICAID | Admitting: Physician Assistant

## 2023-05-30 DIAGNOSIS — M2550 Pain in unspecified joint: Secondary | ICD-10-CM | POA: Diagnosis not present

## 2023-05-30 NOTE — Telephone Encounter (Signed)
 Returned call to the patient and advised that I was retuning a call from a message received on yesterday. She stated her PCP had made a referral to us  and that she was supposed to be seeing a Heart Doctor. Advised this is the Anticoagulation Clinic and inquired if she was taking warfarin/coumadin and she stated she was not just vitamin d . She stated that she just knows she supposed to be seeing a Cardiologist that takes her insurance. Advised that the referral was in the system and it was not for the Anticoagulation Clinic at this time and for a Physician/Cardiologist. Advised this number that she was given is incorrect for that and gave her the main number and advised that the Referral Dept/Schedulers would be calling her regarding setting up an appointment. She asked if she soul call them next week, advised she could and gave [phone] to call. She states she will give them some time and she thanked me for the call back.

## 2023-05-30 NOTE — Progress Notes (Signed)
 Office Visit Note   Patient: Stacie Gibson           Date of Birth: [birth year]           MRN: [id number] Visit Date: 05/30/2023              Requested by: Senaida Dama, NP [address] [place],  [place] [address] PCP: Senaida Dama, NP   Assessment & Plan: Visit Diagnoses:  1. Polyarthralgia     Plan: Patient is a pleasant 48 year old woman with a 1 month history of polyarthralgia.  She says it starts in her shoulders but admits it goes to her whole body in her hips and legs.  Denies any weakness.  She is more painful in the morning.  When moving around.  She was seen and evaluated by primary care who drew an 5 inflammatory labs which were negative with the exception of a elevated sed rate.  She has had not had any fever or chills she did have a vitamin D  drawn which was extremely low at 7.  She now is on a high dose of vitamin D .  I think this is more an inflammatory process or vitamin D  deficiency.  I would like for her to continue on the vitamin D  certainly I believe redrawing a new vitamin D  in a couple months might be appropriate be reevaluated.  If for some reason she still persists with pain despite an elevating vitamin D  I will refer her to rheumatology  Follow-Up Instructions: Return in about 2 months (around 07/30/2023).   Orders:  No orders of the defined types were placed in this encounter.  No orders of the defined types were placed in this encounter.     Procedures: No procedures performed   Clinical Data: No additional findings.   Subjective: No chief complaint on file.   HPI pleasant 48 year old woman presents today with polyarthralgia most noted in the shoulders goes down to her hands also has it in her lower extremities no injury no history of family inflammatory processes no fever no chills been going on for about a month  Review of Systems  All other systems reviewed and are negative.    Objective: Vital Signs: LMP 05/03/2023  (Exact Date)   Physical Exam Constitutional:      Appearance: Normal appearance.  Pulmonary:     Effort: Pulmonary effort is normal.  Skin:    General: Skin is warm and dry.  Neurological:     General: No focal deficit present.     Mental Status: She is alert and oriented to person, place, and time.  Psychiatric:        Mood and Affect: Mood normal.        Behavior: Behavior normal.     Ortho Exam Examination of her shoulder she has full range of motion forward elevation internal rotation by behind her back no impingement findings strength is intact good grip strength Specialty Comments:  No specialty comments available.  Imaging: No results found.   PMFS History: Patient Active Problem List   Diagnosis Date Noted   Polyarthralgia 05/30/2023   Iron  deficiency anemia 03/02/2014   Symptomatic anemia 03/01/2014   Hypokalemia 03/01/2014   Chest pain 10/02/2010   Hypertension 10/02/2010   Thyromegaly 10/02/2010   Past Medical History:  Diagnosis Date   Anxiety    Chest pain    Excessive sweating    Hypertension    Hypokalemia    Iron  deficiency anemia  Pharyngitis    PTSD (post-traumatic stress disorder)    Symptomatic anemia    Thyromegaly     Family History  Problem Relation Age of Onset   Hypertension Mother     History reviewed. No pertinent surgical history. Social History   Occupational History    Comment: Works at [employer]  Tobacco Use   Smoking status: Never   Smokeless tobacco: Never  Vaping Use   Vaping status: Not on file  Substance and Sexual Activity   Alcohol use: No   Drug use: No   Sexual activity: Yes

## 2023-06-03 ENCOUNTER — Other Ambulatory Visit: Payer: Self-pay | Admitting: Family

## 2023-06-03 DIAGNOSIS — Z1231 Encounter for screening mammogram for malignant neoplasm of breast: Secondary | ICD-10-CM

## 2023-06-10 ENCOUNTER — Ambulatory Visit
Admission: RE | Admit: 2023-06-10 | Discharge: 2023-06-10 | Disposition: A | Discharge status: Home / Self Care | Payer: MEDICAID | Source: Ambulatory Visit | Attending: Family | Admitting: Family

## 2023-06-10 DIAGNOSIS — Z1231 Encounter for screening mammogram for malignant neoplasm of breast: Secondary | ICD-10-CM

## 2023-06-16 ENCOUNTER — Ambulatory Visit: Payer: Self-pay | Admitting: Family

## 2023-06-18 DIAGNOSIS — F439 Reaction to severe stress, unspecified: Secondary | ICD-10-CM | POA: Diagnosis not present

## 2023-06-25 DIAGNOSIS — F439 Reaction to severe stress, unspecified: Secondary | ICD-10-CM | POA: Diagnosis not present

## 2023-07-02 DIAGNOSIS — F439 Reaction to severe stress, unspecified: Secondary | ICD-10-CM | POA: Diagnosis not present

## 2023-07-17 ENCOUNTER — Encounter (HOSPITAL_BASED_OUTPATIENT_CLINIC_OR_DEPARTMENT_OTHER): Payer: Self-pay

## 2023-07-23 DIAGNOSIS — F331 Major depressive disorder, recurrent, moderate: Secondary | ICD-10-CM | POA: Diagnosis not present

## 2023-07-30 DIAGNOSIS — F331 Major depressive disorder, recurrent, moderate: Secondary | ICD-10-CM | POA: Diagnosis not present

## 2023-07-31 ENCOUNTER — Ambulatory Visit (HOSPITAL_BASED_OUTPATIENT_CLINIC_OR_DEPARTMENT_OTHER): Admitting: Physician Assistant

## 2023-07-31 ENCOUNTER — Ambulatory Visit: Payer: MEDICAID | Admitting: Physician Assistant

## 2023-08-07 ENCOUNTER — Ambulatory Visit (INDEPENDENT_AMBULATORY_CARE_PROVIDER_SITE_OTHER): Payer: MEDICAID | Admitting: Physician Assistant

## 2023-08-07 ENCOUNTER — Encounter (HOSPITAL_BASED_OUTPATIENT_CLINIC_OR_DEPARTMENT_OTHER): Payer: Self-pay | Admitting: Physician Assistant

## 2023-08-07 DIAGNOSIS — M25561 Pain in right knee: Secondary | ICD-10-CM | POA: Diagnosis not present

## 2023-08-07 DIAGNOSIS — M2550 Pain in unspecified joint: Secondary | ICD-10-CM | POA: Diagnosis not present

## 2023-08-07 DIAGNOSIS — M25562 Pain in left knee: Secondary | ICD-10-CM

## 2023-08-07 NOTE — Progress Notes (Signed)
 Office Visit Note   Patient: Stacie Gibson           Date of Birth: 05-22-1975           MRN: [id number] Visit Date: 08/07/2023              Requested by: Lorren Greig PARAS, NP [address] [place],  [place] [address] PCP: Lorren Greig PARAS, NP  No chief complaint on file.     HPI: Patient comes in today for follow-up on her polyarthralgia.  She had an extremely low vitamin D  she did take a vitamin D  50,000 units once a week for about 3 or 4 weeks then she had some nausea so discontinued it.  She does however feel that she has significantly joint pain has dissipated she still has some knee pain but she thinks this is from bad knees that seem to run in her family  Assessment & Plan: Visit Diagnoses:  1. Acute pain of both knees   2. Polyarthralgia     Plan: I will redraw vitamin D  today based on this she could reduce to a dose of 1 to 2000 international units daily she might be able to tolerate this better.  I will refer her to physical therapy for bilateral knee pain will follow-up with me if she does not improve  Follow-Up Instructions: No follow-ups on file.   Ortho Exam  Patient is alert, oriented, no adenopathy, well-dressed, normal affect, normal respiratory effort. Bilateral knees no effusion no erythema compartments and soft and compressible she is neurovascularly intact    Imaging: No results found. No images are attached to the encounter.  Labs: Lab Results  Component Value Date   HGBA1C 5.3 09/21/2020   ESRSEDRATE 45 (H) 05/21/2023     Lab Results  Component Value Date   ALBUMIN 3.6 08/16/2022   ALBUMIN 4.1 08/03/2020    No results found for: MG Lab Results  Component Value Date   VD25OH 7.3 (L) 05/21/2023    No results found for: PREALBUMIN    Latest Ref Rng & Units 05/13/2023   11:23 AM 08/23/2022    1:29 PM 08/16/2022    1:48 PM  CBC EXTENDED  WBC 4.0 - 10.5 K/uL 5.3  5.1  5.5   RBC 3.87 - 5.11 MIL/uL 4.23  3.69  4.00    Hemoglobin 12.0 - 15.0 g/dL 8.1  6.5  7.0   HCT 63.9 - 46.0 % 28.6  24.3  26.0   Platelets 150 - 400 K/uL 371  349  450   NEUT# 1.7 - 7.7 K/uL  2.9  3.3   Lymph# 0.7 - 4.0 K/uL  1.6  1.7      There is no height or weight on file to calculate BMI.  Orders:  Orders Placed This Encounter  Procedures   Vitamin D  1,25 dihydroxy   Ambulatory referral to Physical Therapy   No orders of the defined types were placed in this encounter.    Procedures: No procedures performed  Clinical Data: No additional findings.  ROS:  All other systems negative, except as noted in the HPI. Review of Systems  Objective: Vital Signs: There were no vitals taken for this visit.  Specialty Comments:  No specialty comments available.  PMFS History: Patient Active Problem List   Diagnosis Date Noted   Polyarthralgia 05/30/2023   Iron  deficiency anemia 03/02/2014   Symptomatic anemia 03/01/2014   Hypokalemia 03/01/2014   Chest pain 10/02/2010  Hypertension 10/02/2010   Thyromegaly 10/02/2010   Past Medical History:  Diagnosis Date   Anxiety    Chest pain    Excessive sweating    Hypertension    Hypokalemia    Iron  deficiency anemia    Pharyngitis    PTSD (post-traumatic stress disorder)    Symptomatic anemia    Thyromegaly     Family History  Problem Relation Age of Onset   Hypertension Mother     History reviewed. No pertinent surgical history. Social History   Occupational History    Comment: Works at [employer]  Tobacco Use   Smoking status: Never   Smokeless tobacco: Never  Vaping Use   Vaping status: Not on file  Substance and Sexual Activity   Alcohol use: No   Drug use: No   Sexual activity: Yes

## 2023-08-20 DIAGNOSIS — F331 Major depressive disorder, recurrent, moderate: Secondary | ICD-10-CM | POA: Diagnosis not present

## 2023-08-27 ENCOUNTER — Ambulatory Visit: Attending: Physician Assistant

## 2023-08-27 ENCOUNTER — Other Ambulatory Visit: Payer: Self-pay

## 2023-08-27 DIAGNOSIS — M25562 Pain in left knee: Secondary | ICD-10-CM | POA: Insufficient documentation

## 2023-08-27 DIAGNOSIS — R2689 Other abnormalities of gait and mobility: Secondary | ICD-10-CM | POA: Diagnosis present

## 2023-08-27 DIAGNOSIS — M6281 Muscle weakness (generalized): Secondary | ICD-10-CM | POA: Insufficient documentation

## 2023-08-27 DIAGNOSIS — M25561 Pain in right knee: Secondary | ICD-10-CM | POA: Insufficient documentation

## 2023-08-27 DIAGNOSIS — F331 Major depressive disorder, recurrent, moderate: Secondary | ICD-10-CM | POA: Diagnosis not present

## 2023-08-27 NOTE — Therapy (Unsigned)
 OUTPATIENT PHYSICAL THERAPY LOWER EXTREMITY EVALUATION   Patient Name: Stacie Gibson MRN: [id number] DOB:1975/09/17, 48 y.o., female Today's Date: 08/27/2023  END OF SESSION:  PT End of Session - 08/27/23 1356     Visit Number 1    Number of Visits 17    Date for PT Re-Evaluation 10/22/23    Authorization Type [employer]    PT Start Time 1312    PT Stop Time 1354    PT Time Calculation (min) 42 min    Activity Tolerance Patient tolerated treatment well    Behavior During Therapy WFL for tasks assessed/performed          Past Medical History:  Diagnosis Date   Anxiety    Chest pain    Excessive sweating    Hypertension    Hypokalemia    Iron  deficiency anemia    Pharyngitis    PTSD (post-traumatic stress disorder)    Symptomatic anemia    Thyromegaly    History reviewed. No pertinent surgical history. Patient Active Problem List   Diagnosis Date Noted   Polyarthralgia 05/30/2023   Iron  deficiency anemia 03/02/2014   Symptomatic anemia 03/01/2014   Hypokalemia 03/01/2014   Chest pain 10/02/2010   Hypertension 10/02/2010   Thyromegaly 10/02/2010    PCP: Lorren Greig PARAS, NP  REFERRING PROVIDER: Persons, Ronal Dragon, PA  REFERRING DIAG: [phone] (ICD-10-CM) - Acute pain of both knees   THERAPY DIAG:  Pain in both knees, unspecified chronicity  Muscle weakness (generalized)  Other abnormalities of gait and mobility  Rationale for Evaluation and Treatment: Rehabilitation  ONSET DATE: May 2025  SUBJECTIVE:   SUBJECTIVE STATEMENT: Pt presents to PT with reports of recent onset of bilateral knee pain. She notes that pain only occurs when she sits for longer that 30 minutes. No trauma or MOI, pain does not occur when she is walking or moving. Sometimes her L knee will hurt in the middle of the night and she has to change sleeping position. Denies N/T or any other sensation change down LE.   PERTINENT HISTORY: HTN  PAIN:  Are you having pain?  Yes:  NPRS scale: 5/10 Worst: 10/10 Pain location: bilateral knee, lower joint line Pain description: sharp, sore Aggravating factors: sitting Relieving factors: movement  PRECAUTIONS: None  RED FLAGS: None   WEIGHT BEARING RESTRICTIONS: No  FALLS:  Has patient fallen in last 6 months? No  LIVING ENVIRONMENT: Lives with: lives alone Lives in: House/apartment Stairs: No  OCCUPATION: Home Health Aide  PLOF: Independent  PATIENT GOALS: decrease knee pain when sitting  NEXT MD VISIT: PRN  OBJECTIVE:  Note: Objective measures were completed at Evaluation unless otherwise noted.  DIAGNOSTIC FINDINGS: See imaging   PATIENT SURVEYS:  LEFS  Extreme difficulty/unable (0), Quite a bit of difficulty (1), Moderate difficulty (2), Little difficulty (3), No difficulty (4) Survey date:  08/27/2023  Any of your usual work, housework or school activities 4  2. Usual hobbies, recreational or sporting activities 4  3. Getting into/out of the bath 4  4. Walking between rooms 3  5. Putting on socks/shoes 3  6. Squatting  0  7. Lifting an object, like a bag of groceries from the floor 4  8. Performing light activities around your home 4  9. Performing heavy activities around your home 4  10. Getting into/out of a car 3  11. Walking 2 blocks 4  12. Walking 1 mile 4  13. Going up/down 10 stairs (1 flight) 3  14.  Standing for 1 hour 4  15.  sitting for 1 hour 1  16. Running on even ground 0  17. Running on uneven ground 0  18. Making sharp turns while running fast 0  19. Hopping  0  20. Rolling over in bed 4  Score total:  56/80     COGNITION: Overall cognitive status: Within functional limits for tasks assessed     SENSATION: WFL  EDEMA:  DNT   POSTURE: medium body habitus  PALPATION: TTP to bilateral joint line, patellar hypomobility  LOWER EXTREMITY ROM:  Active ROM Right eval Left eval  Hip flexion    Hip extension    Hip abduction    Hip adduction    Hip  internal rotation    Hip external rotation    Knee flexion 122 118  Knee extension 0 0  Ankle dorsiflexion    Ankle plantarflexion    Ankle inversion    Ankle eversion     (Blank rows = not tested)  LOWER EXTREMITY MMT:  MMT Right eval Left eval  Hip flexion 4 3+  Hip extension    Hip abduction 4 3  Hip adduction    Hip internal rotation    Hip external rotation    Knee flexion 4 4  Knee extension 4 3+  Ankle dorsiflexion    Ankle plantarflexion    Ankle inversion    Ankle eversion     (Blank rows = not tested)  LOWER EXTREMITY SPECIAL TESTS:  Knee special tests: Patellafemoral grind test: negative  FUNCTIONAL TESTS:  30 Second Sit to Stand: 16 reps  GAIT: Distance walked: 45ft Assistive device utilized: None Level of assistance: Complete Independence Comments: no overt gait deviations    TREATMENT:                                                                                                           TREATMENT DATE: [facility] Adult PT Treatment:                                                DATE: 08/27/2023 Therapeutic Exercise: Supine QS x 5 - 5 hold SLR x 5 ea S/L hip abd x 5 ea LAQ x 5 ea STS x 10   PATIENT EDUCATION:  Education details: eval findings, LEFS, HEP, POC Person educated: Patient Education method: Explanation, Demonstration, and Handouts Education comprehension: verbalized understanding and returned demonstration  HOME EXERCISE PROGRAM: Access Code: [email or web address] URL: [email or web address] Date: 08/27/2023 Prepared by: Alm Kingdom  Exercises - Supine Quad Set  - 1 x daily - 7 x weekly - 3 sets - 10 reps - 5 sec hold - Active Straight Leg Raise with Quad Set  - 1 x daily - 7 x weekly - 3 sets - 10 reps - Sidelying Hip Abduction  - 1 x daily - 7 x weekly - 3 sets - 10 reps - Seated Long Arc  Quad  - 1 x daily - 7 x weekly - 3 sets - 10 reps - 3-5 sec hold - Sit to Stand Without Arm Support  - 1 x daily - 7 x weekly - 2 sets -  10 reps  ASSESSMENT:  CLINICAL IMPRESSION: Patient is a 48 y.o. F who was seen today for physical therapy evaluation and treatment for bilateral knee pain. Physical findings are consistent with referring provider impression as pt demonstrates decrease in bilateral LE strength and patellar hypomobility. LEFS score shows she is operating below baseline PLOF for higher level mobility and home ADLs. She would benefit from skilled PT services working specifically on improving quad and lateral hip strength in order to decrease pain and improve function.  OBJECTIVE IMPAIRMENTS: Abnormal gait, decreased activity tolerance, decreased mobility, difficulty walking, decreased strength, and pain.   ACTIVITY LIMITATIONS: carrying, lifting, standing, squatting, stairs, transfers, and locomotion level  PARTICIPATION LIMITATIONS: community activity, occupation, and yard work  PERSONAL FACTORS: 1-2 comorbidities: HTN are also affecting patient's functional outcome.   REHAB POTENTIAL: Excellent  CLINICAL DECISION MAKING: Stable/uncomplicated  EVALUATION COMPLEXITY: Low   GOALS: Goals reviewed with patient? No  SHORT TERM GOALS: Target date: 09/17/2023   Pt will be compliant and knowledgeable with initial HEP for improved comfort and carryover Baseline: initial HEP given  Goal status: INITIAL  2.  Pt will self report bilateral knee pain no greater than 7/10 for improved comfort and functional ability Baseline: 10/10 at worst Goal status: INITIAL   LONG TERM GOALS: Target date: 10/22/2023   Pt will improve LEFS to no less than 65/80 as proxy for functional improvement with home ADLs and higher level community activity Baseline: 56/80 Goal status: INITIAL   2.  Pt will self report bilateral knee pain no greater than 3/10 for improved comfort and functional ability Baseline: 10/10 at worst Goal status: INITIAL   3.  Pt will improve all LE MMT to no less than 4/5 for improved knee stability and  decreased pain Baseline: see MMT chart Goal status: INITIAL  4.  Pt will be able to sit >30 minutes without increase in bilateral knee pain for improved comfort and function Baseline: unable Goal status: INITIAL   PLAN:  PT FREQUENCY: 1-2x/week  PT DURATION: 8 weeks  PLANNED INTERVENTIONS: 97164- PT Re-evaluation, 97110-Therapeutic exercises, 97530- Therapeutic activity, 97112- Neuromuscular re-education, 97535- Self Care, 02859- Manual therapy, [id number]- Gait training, [phone]- Aquatic Therapy, [phone]- Electrical stimulation (unattended), [id number]- Electrical stimulation (manual), 20560 (1-2 muscles), 20561 (3+ muscles)- Dry Needling, Cryotherapy, and Moist heat  PLAN FOR NEXT SESSION: assess HEP response, quad and hip strengthening  Alm JAYSON Kingdom PT  08/28/23 8:43 AM

## 2023-08-27 NOTE — Therapy (Incomplete)
 OUTPATIENT PHYSICAL THERAPY LOWER EXTREMITY EVALUATION   Patient Name: Stacie Gibson MRN: [id number] DOB:1975-11-29, 48 y.o., female Today's Date: 08/27/2023  END OF SESSION:   Past Medical History:  Diagnosis Date   Anxiety    Chest pain    Excessive sweating    Hypertension    Hypokalemia    Iron  deficiency anemia    Pharyngitis    PTSD (post-traumatic stress disorder)    Symptomatic anemia    Thyromegaly    No past surgical history on file. Patient Active Problem List   Diagnosis Date Noted   Polyarthralgia 05/30/2023   Iron  deficiency anemia 03/02/2014   Symptomatic anemia 03/01/2014   Hypokalemia 03/01/2014   Chest pain 10/02/2010   Hypertension 10/02/2010   Thyromegaly 10/02/2010    PCP: Lorren Greig PARAS, NP  REFERRING PROVIDER: Persons, Ronal Dragon, [place]  REFERRING DIAG: [phone] (ICD-10-CM) - Acute pain of both knees   THERAPY DIAG:  No diagnosis found.  Rationale for Evaluation and Treatment: Rehabilitation  ONSET DATE: ***  SUBJECTIVE:   SUBJECTIVE STATEMENT: Pt is a 48 yr old F referred to PT for bilateral knee pain that began ***  PERTINENT HISTORY: *** PAIN:  Are you having pain?  Yes: NPRS scale: *** Pain location: *** Pain description: *** Aggravating factors: *** Relieving factors: ***  PRECAUTIONS: {Therapy precautions:24002}  RED FLAGS: {PT Red Flags:29287}   WEIGHT BEARING RESTRICTIONS: {Yes ***/No:24003}  FALLS:  Has patient fallen in last 6 months? {fallsyesno:27318}  LIVING ENVIRONMENT: Lives with: {[facility] lives with:25569::lives with their family} Lives in: {Lives in:25570} Stairs: {opstairs:27293} Has following equipment at home: {Assistive devices:23999}  OCCUPATION: ***  PLOF: {PLOF:24004}  PATIENT GOALS: ***  NEXT MD VISIT: ***  OBJECTIVE:  Note: Objective measures were completed at Evaluation unless otherwise noted.  DIAGNOSTIC FINDINGS: ***  PATIENT SURVEYS:  {rehab  surveys:24030}  COGNITION: Overall cognitive status: {cognition:24006}     SENSATION: {sensation:27233}  EDEMA:  {edema:24020}  MUSCLE LENGTH: Hamstrings: Right *** deg; Left *** deg Debby test: Right *** deg; Left *** deg  POSTURE: {posture:25561}  PALPATION: ***  LOWER EXTREMITY ROM:  {AROM/PROM:27142} ROM Right eval Left eval  Hip flexion    Hip extension    Hip abduction    Hip adduction    Hip internal rotation    Hip external rotation    Knee flexion    Knee extension    Ankle dorsiflexion    Ankle plantarflexion    Ankle inversion    Ankle eversion     (Blank rows = not tested)  LOWER EXTREMITY MMT:  MMT Right eval Left eval  Hip flexion    Hip extension    Hip abduction    Hip adduction    Hip internal rotation    Hip external rotation    Knee flexion    Knee extension    Ankle dorsiflexion    Ankle plantarflexion    Ankle inversion    Ankle eversion     (Blank rows = not tested)  LOWER EXTREMITY SPECIAL TESTS:  {LEspecialtests:26242}  FUNCTIONAL TESTS:  {Functional tests:24029}  GAIT: Distance walked: *** Assistive device utilized: {Assistive devices:23999} Level of assistance: {Levels of assistance:24026} Comments: ***  TREATMENT DATE: TREATMENT: [facility] Adult PT Treatment:                                                DATE: *** Therapeutic Exercise: *** Manual Therapy: *** Neuromuscular re-ed: *** Therapeutic Activity: *** Modalities: *** Self Care: ***     PATIENT EDUCATION:  Education details: *** Person educated: {Person educated:25204} Education method: {Education Method:25205} Education comprehension: {Education Comprehension:25206}  HOME EXERCISE PROGRAM: ***  ASSESSMENT:  CLINICAL IMPRESSION: Patient is a 48 y.o. F who was seen today for physical therapy evaluation and  treatment for bilateral knee pain.   OBJECTIVE IMPAIRMENTS: {opptimpairments:25111}.   ACTIVITY LIMITATIONS: {activitylimitations:27494}  PARTICIPATION LIMITATIONS: {participationrestrictions:25113}  PERSONAL FACTORS: {Personal factors:25162} are also affecting patient's functional outcome.   REHAB POTENTIAL: {rehabpotential:25112}  CLINICAL DECISION MAKING: {clinical decision making:25114}  EVALUATION COMPLEXITY: {Evaluation complexity:25115}   GOALS: Goals reviewed with patient? {yes/no:20286}  SHORT TERM GOALS: Target date: *** *** Baseline: Goal status: INITIAL  2.  *** Baseline:  Goal status: INITIAL  3.  *** Baseline:  Goal status: INITIAL  4.  *** Baseline:  Goal status: INITIAL  5.  *** Baseline:  Goal status: INITIAL  6.  *** Baseline:  Goal status: INITIAL  LONG TERM GOALS: Target date: ***  *** Baseline:  Goal status: INITIAL  2.  *** Baseline:  Goal status: INITIAL  3.  *** Baseline:  Goal status: INITIAL  4.  *** Baseline:  Goal status: INITIAL  5.  *** Baseline:  Goal status: INITIAL  6.  *** Baseline:  Goal status: INITIAL   PLAN:  PT FREQUENCY: {rehab frequency:25116}  PT DURATION: {rehab duration:25117}  PLANNED INTERVENTIONS: {rehab planned interventions:25118::97110-Therapeutic exercises,97530- Therapeutic [phone]- Neuromuscular re-education,97535- Self Rjmz,02859- Manual therapy}  PLAN FOR NEXT SESSION: ***   **

## 2023-09-03 ENCOUNTER — Encounter: Payer: Self-pay | Admitting: Physical Therapy

## 2023-09-03 ENCOUNTER — Ambulatory Visit: Admitting: Physical Therapy

## 2023-09-03 DIAGNOSIS — R2689 Other abnormalities of gait and mobility: Secondary | ICD-10-CM

## 2023-09-03 DIAGNOSIS — M25562 Pain in left knee: Secondary | ICD-10-CM

## 2023-09-03 DIAGNOSIS — M25561 Pain in right knee: Secondary | ICD-10-CM | POA: Diagnosis not present

## 2023-09-03 DIAGNOSIS — M6281 Muscle weakness (generalized): Secondary | ICD-10-CM

## 2023-09-03 NOTE — Therapy (Signed)
 OUTPATIENT PHYSICAL THERAPY TREATMENT   Patient Name: NATAJAH DERDERIAN MRN: [id number] DOB:14-Feb-1975, 48 y.o., female Today's Date: 09/03/2023  END OF SESSION:  PT End of Session - 09/03/23 1413     Visit Number 2    Number of Visits 17    Date for PT Re-Evaluation 10/22/23    Authorization Type [employer]    PT Start Time 1412    PT Stop Time 1458    PT Time Calculation (min) 46 min    Activity Tolerance Patient tolerated treatment well    Behavior During Therapy WFL for tasks assessed/performed           Past Medical History:  Diagnosis Date   Anxiety    Chest pain    Excessive sweating    Hypertension    Hypokalemia    Iron  deficiency anemia    Pharyngitis    PTSD (post-traumatic stress disorder)    Symptomatic anemia    Thyromegaly    History reviewed. No pertinent surgical history. Patient Active Problem List   Diagnosis Date Noted   Polyarthralgia 05/30/2023   Iron  deficiency anemia 03/02/2014   Symptomatic anemia 03/01/2014   Hypokalemia 03/01/2014   Chest pain 10/02/2010   Hypertension 10/02/2010   Thyromegaly 10/02/2010    PCP: Lorren Greig PARAS, NP  REFERRING PROVIDER: Persons, Ronal Dragon, [place]  REFERRING DIAG: [phone] (ICD-10-CM) - Acute pain of both knees   THERAPY DIAG:  Pain in both knees, unspecified chronicity  Muscle weakness (generalized)  Other abnormalities of gait and mobility  Rationale for Evaluation and Treatment: Rehabilitation  ONSET DATE: May 2025  SUBJECTIVE:   SUBJECTIVE STATEMENT: 09/03/2023  today I am having a 5/10.  EVAL: Pt presents to PT with reports of recent onset of bilateral knee pain. She notes that pain only occurs when she sits for longer that 30 minutes. No trauma or MOI, pain does not occur when she is walking or moving. Sometimes her L knee will hurt in the middle of the night and she has to change sleeping position. Denies N/T or any other sensation change down LE.   PERTINENT HISTORY: HTN  PAIN:   Are you having pain?  Yes: NPRS scale: 5/10 Worst: 10/10 Pain location: bilateral knee, lower joint line Pain description: sharp, sore Aggravating factors: sitting Relieving factors: movement  PRECAUTIONS: None  RED FLAGS: None   WEIGHT BEARING RESTRICTIONS: No  FALLS:  Has patient fallen in last 6 months? No  LIVING ENVIRONMENT: Lives with: lives alone Lives in: House/apartment Stairs: No  OCCUPATION: Home Health Aide  PLOF: Independent  PATIENT GOALS: decrease knee pain when sitting  NEXT MD VISIT: PRN  OBJECTIVE:  Note: Objective measures were completed at Evaluation unless otherwise noted.  DIAGNOSTIC FINDINGS: See imaging   PATIENT SURVEYS:  LEFS  Extreme difficulty/unable (0), Quite a bit of difficulty (1), Moderate difficulty (2), Little difficulty (3), No difficulty (4) Survey date:  08/27/2023  Any of your usual work, housework or school activities 4  2. Usual hobbies, recreational or sporting activities 4  3. Getting into/out of the bath 4  4. Walking between rooms 3  5. Putting on socks/shoes 3  6. Squatting  0  7. Lifting an object, like a bag of groceries from the floor 4  8. Performing light activities around your home 4  9. Performing heavy activities around your home 4  10. Getting into/out of a car 3  11. Walking 2 blocks 4  12. Walking 1 mile 4  13.  Going up/down 10 stairs (1 flight) 3  14. Standing for 1 hour 4  15.  sitting for 1 hour 1  16. Running on even ground 0  17. Running on uneven ground 0  18. Making sharp turns while running fast 0  19. Hopping  0  20. Rolling over in bed 4  Score total:  56/80     COGNITION: Overall cognitive status: Within functional limits for tasks assessed     SENSATION: WFL  EDEMA:  DNT   POSTURE: medium body habitus  PALPATION: TTP to bilateral joint line, patellar hypomobility  LOWER EXTREMITY ROM:  Active ROM Right eval Left eval  Hip flexion    Hip extension    Hip abduction     Hip adduction    Hip internal rotation    Hip external rotation    Knee flexion 122 118  Knee extension 0 0  Ankle dorsiflexion    Ankle plantarflexion    Ankle inversion    Ankle eversion     (Blank rows = not tested)  LOWER EXTREMITY MMT:  MMT Right eval Left eval  Hip flexion 4 3+  Hip extension    Hip abduction 4 3  Hip adduction    Hip internal rotation    Hip external rotation    Knee flexion 4 4  Knee extension 4 3+  Ankle dorsiflexion    Ankle plantarflexion    Ankle inversion    Ankle eversion     (Blank rows = not tested)  LOWER EXTREMITY SPECIAL TESTS:  Knee special tests: Patellafemoral grind test: negative  FUNCTIONAL TESTS:  30 Second Sit to Stand: 16 reps  GAIT: Distance walked: 51ft Assistive device utilized: None Level of assistance: Complete Independence Comments: no overt gait deviations   [facility] Adult PT Treatment:                                                DATE: 09/03/23 MTPR along the L vastus lateralis LAQ with ball squeeze 2 x 20 LLE only STW along the R vastus using tennis ball Sidelying hip abduction 2 x 12  Sit to stand with GTB around the knees 2 x 10 Stair training on 6 inch steps x 4 Step downs on 6 inch step 2 x 10 Reviewed/ updated HEP  [facility] Adult PT Treatment:                                                DATE: 08/27/2023 Therapeutic Exercise: Supine QS x 5 - 5 hold SLR x 5 ea S/L hip abd x 5 ea LAQ x 5 ea STS x 10   PATIENT EDUCATION:  Education details: eval findings, LEFS, HEP, POC Person educated: Patient Education method: Explanation, Demonstration, and Handouts Education comprehension: verbalized understanding and returned demonstration  HOME EXERCISE PROGRAM: Access Code: [email or web address] URL: [email or web address] Date: 09/03/2023 Prepared by: Joneen Fresh  Exercises - Supine Quad Set  - 1 x daily - 7 x weekly - 3 sets - 10 reps - 5 sec hold - Active Straight Leg Raise with Quad Set  -  1 x daily - 7 x weekly - 3 sets - 10 reps - Sidelying Hip Abduction  - 1 x  daily - 7 x weekly - 3 sets - 10 reps - Seated Long Arc Quad  - 1 x daily - 7 x weekly - 3 sets - 10 reps - 3-5 sec hold - Sit to Stand Without Arm Support  - 1 x daily - 7 x weekly - 2 sets - 10 reps - Seated Long Arc Quad with Hip Adduction  - 1 x daily - 7 x weekly - 2 sets - 10 reps  ASSESSMENT:  CLINICAL IMPRESSION: 09/03/2023 Mrs Urschel reports feeling a little better since the evaluation and has been consistent with her HEP. Continued working on patellar stability with MTPR along the vastus lateralis and VMO activation, as well as glute med activation. Worked on stair training and quad activation which she did well with. End of session she noted feeling overall good.   Evaluation: Patient is a 48 y.o. F who was seen today for physical therapy evaluation and treatment for bilateral knee pain. Physical findings are consistent with referring provider impression as pt demonstrates decrease in bilateral LE strength and patellar hypomobility. LEFS score shows she is operating below baseline PLOF for higher level mobility and home ADLs. She would benefit from skilled PT services working specifically on improving quad and lateral hip strength in order to decrease pain and improve function.  OBJECTIVE IMPAIRMENTS: Abnormal gait, decreased activity tolerance, decreased mobility, difficulty walking, decreased strength, and pain.   ACTIVITY LIMITATIONS: carrying, lifting, standing, squatting, stairs, transfers, and locomotion level  PARTICIPATION LIMITATIONS: community activity, occupation, and yard work  PERSONAL FACTORS: 1-2 comorbidities: HTN are also affecting patient's functional outcome.   REHAB POTENTIAL: Excellent  CLINICAL DECISION MAKING: Stable/uncomplicated  EVALUATION COMPLEXITY: Low   GOALS: Goals reviewed with patient? No  SHORT TERM GOALS: Target date: 09/17/2023   Pt will be compliant and  knowledgeable with initial HEP for improved comfort and carryover Baseline: initial HEP given  Goal status: INITIAL  2.  Pt will self report bilateral knee pain no greater than 7/10 for improved comfort and functional ability Baseline: 10/10 at worst Goal status: INITIAL   LONG TERM GOALS: Target date: 10/22/2023   Pt will improve LEFS to no less than 65/80 as proxy for functional improvement with home ADLs and higher level community activity Baseline: 56/80 Goal status: INITIAL   2.  Pt will self report bilateral knee pain no greater than 3/10 for improved comfort and functional ability Baseline: 10/10 at worst Goal status: INITIAL   3.  Pt will improve all LE MMT to no less than 4/5 for improved knee stability and decreased pain Baseline: see MMT chart Goal status: INITIAL  4.  Pt will be able to sit >30 minutes without increase in bilateral knee pain for improved comfort and function Baseline: unable Goal status: INITIAL   PLAN:  PT FREQUENCY: 1-2x/week  PT DURATION: 8 weeks  PLANNED INTERVENTIONS: 97164- PT Re-evaluation, 97110-Therapeutic exercises, 97530- Therapeutic activity, 97112- Neuromuscular re-education, 97535- Self Care, 02859- Manual therapy, [id number]- Gait training, [phone]- Aquatic Therapy, [phone]- Electrical stimulation (unattended), [id number]- Electrical stimulation (manual), 20560 (1-2 muscles), 20561 (3+ muscles)- Dry Needling, Cryotherapy, and Moist heat  PLAN FOR NEXT SESSION: assess HEP response, quad and hip strengthening  Truong Delcastillo PT, DPT, LAT, ATC  09/03/23  3:13 PM

## 2023-09-10 ENCOUNTER — Ambulatory Visit: Payer: MEDICAID

## 2023-09-16 ENCOUNTER — Ambulatory Visit (INDEPENDENT_AMBULATORY_CARE_PROVIDER_SITE_OTHER): Payer: MEDICAID | Admitting: Nurse Practitioner

## 2023-09-16 ENCOUNTER — Encounter: Payer: Self-pay | Admitting: Nurse Practitioner

## 2023-09-16 VITALS — BP 124/79 | HR 69 | Wt 234.0 lb

## 2023-09-16 DIAGNOSIS — Z113 Encounter for screening for infections with a predominantly sexual mode of transmission: Secondary | ICD-10-CM

## 2023-09-16 DIAGNOSIS — N926 Irregular menstruation, unspecified: Secondary | ICD-10-CM | POA: Insufficient documentation

## 2023-09-16 NOTE — Progress Notes (Signed)
 Acute Office Visit  Subjective:     Patient ID: Stacie Gibson, female    DOB: 01-Jun-1975, 48 y.o.   MRN: [id number]  Chief Complaint  Patient presents with   Menstrual Problem      Discussed the use of AI scribe software for clinical note transcription with the patient, who gave verbal consent to proceed.  History of Present Illness Stacie Gibson is a 48 year old female  has a past medical history of Anxiety, Chest pain, Excessive sweating, Hypertension, Hypokalemia, Iron  deficiency anemia, Pharyngitis, PTSD (post-traumatic stress disorder), Symptomatic anemia, and Thyromegaly.  who presents with concerns about menstrual irregularities and foul odor during menstruation. Patient of Lorren No NP  She experienced two menstrual cycles in August, which is unusual for her. The second cycle was accompanied by a foul odor. Each cycle lasted about four to five days with a normal flow and no associated pain.  She noted a stinging sensation before the onset of the second cycle, which resolved once menstruation began. There was no discharge or burning during the cycle, and these symptoms have since resolved.  She has not experienced similar symptoms in the past and has not had sexual intercourse in the past two years, which she believes rules out sexually transmitted infections as a cause. She is currently expecting her next menstrual cycle to begin soon, as it is due around the time of the visit.  She has been in a long-term relationship for twenty years but has not been sexually active for the past two years. She maintains a healthy lifestyle by not drinking or smoking, drinking plenty of water, and avoiding sodas for the past ten years.     Assessment & Plan     Review of Systems  Constitutional:  Negative for appetite change, chills, fatigue and fever.  HENT:  Negative for congestion, postnasal drip, rhinorrhea and sneezing.   Respiratory:  Negative for cough, shortness of breath  and wheezing.   Cardiovascular:  Negative for chest pain, palpitations and leg swelling.  Gastrointestinal:  Negative for abdominal pain, constipation, nausea and vomiting.  Genitourinary:  Positive for menstrual problem. Negative for difficulty urinating, dysuria, flank pain and frequency.  Musculoskeletal:  Negative for back pain and neck pain.  Skin:  Negative for color change, pallor, rash and wound.  Neurological:  Negative for dizziness, facial asymmetry, weakness, numbness and headaches.  Psychiatric/Behavioral:  Negative for behavioral problems, confusion, self-injury and suicidal ideas.         Objective:    BP 124/79   Pulse 69   Wt 234 lb (106.1 kg)   BMI 35.58 kg/m    Physical Exam Vitals and nursing note reviewed.  Constitutional:      General: She is not in acute distress.    Appearance: Normal appearance. She is obese. She is not ill-appearing, toxic-appearing or diaphoretic.  Cardiovascular:     Rate and Rhythm: Normal rate and regular rhythm.     Pulses: Normal pulses.     Heart sounds: Normal heart sounds. No murmur heard.    No friction rub. No gallop.  Pulmonary:     Effort: Pulmonary effort is normal. No respiratory distress.     Breath sounds: Normal breath sounds. No stridor. No wheezing, rhonchi or rales.  Chest:     Chest wall: No tenderness.  Abdominal:     General: There is no distension.     Palpations: Abdomen is soft.     Tenderness: There is no  abdominal tenderness. There is no right CVA tenderness, left CVA tenderness or guarding.  Musculoskeletal:        General: No swelling, deformity or signs of injury.     Right lower leg: No edema.     Left lower leg: No edema.  Skin:    General: Skin is warm and dry.     Capillary Refill: Capillary refill takes less than 2 seconds.     Coloration: Skin is not jaundiced or pale.     Findings: No bruising, erythema or lesion.  Neurological:     Mental Status: She is alert and oriented to person,  place, and time.     Motor: No weakness.     Gait: Gait normal.  Psychiatric:        Mood and Affect: Mood normal.        Behavior: Behavior normal.        Thought Content: Thought content normal.        Judgment: Judgment normal.     No results found for any visits on 09/16/23.      Assessment & Plan:   Problem List Items Addressed This Visit       Other   Irregular menstruation - Primary   Abnormal uterine bleeding Experienced two menstrual cycles in August with foul odor and stinging sensation. Normal flow, no pain. No current symptoms of infection. Unlikely STD due to lack of sexual activity for two years. But she would like STD testing  At 81, may be approaching menopause. Age and menstrual changes suggest perimenopausal transition.  Follow up with PCP if symptoms persist, may need a referral to GYN        Other Visit Diagnoses       Screening for STD (sexually transmitted disease)       Relevant Orders   NuSwab Vaginitis Plus (VG+)   HepB+HepC+HIV Panel   RPR       No orders of the defined types were placed in this encounter.   No follow-ups on file.  Hayden Kihara R Neymar Dowe, FNP

## 2023-09-16 NOTE — Patient Instructions (Signed)
 1. Irregular menstruation (Primary)   2. Screening for STD (sexually transmitted disease)  - NuSwab Vaginitis Plus (VG+) - HepB+HepC+HIV Panel - RPR    It is important that you exercise regularly at least 30 minutes 5 times a week as tolerated  Think about what you will eat, plan ahead. Choose  clean, green, fresh or frozen over canned, processed or packaged foods which are more sugary, salty and fatty. 70 to 75% of food eaten should be vegetables and fruit. Three meals at set times with snacks allowed between meals, but they must be fruit or vegetables. Aim to eat over a 12 hour period , example 7 am to 7 pm, and STOP after  your last meal of the day. Drink water,generally about 64 ounces per day, no other drink is as healthy. Fruit juice is best enjoyed in a healthy way, by EATING the fruit.  Thanks for choosing [facility] we consider it a privelige to serve you.

## 2023-09-16 NOTE — Assessment & Plan Note (Addendum)
 Abnormal uterine bleeding Experienced two menstrual cycles in August with foul odor and stinging sensation. Normal flow, no pain. No current symptoms of infection. Unlikely STD due to lack of sexual activity for two years. But she would like STD testing  At 75, may be approaching menopause. Age and menstrual changes suggest perimenopausal transition.  Follow up with PCP if symptoms persist, may need a referral to GYN

## 2023-09-17 ENCOUNTER — Ambulatory Visit: Payer: MEDICAID | Attending: Physician Assistant

## 2023-09-17 DIAGNOSIS — M25562 Pain in left knee: Secondary | ICD-10-CM | POA: Diagnosis present

## 2023-09-17 DIAGNOSIS — R2689 Other abnormalities of gait and mobility: Secondary | ICD-10-CM | POA: Insufficient documentation

## 2023-09-17 DIAGNOSIS — M6281 Muscle weakness (generalized): Secondary | ICD-10-CM | POA: Insufficient documentation

## 2023-09-17 DIAGNOSIS — M25561 Pain in right knee: Secondary | ICD-10-CM | POA: Insufficient documentation

## 2023-09-17 NOTE — Therapy (Signed)
 OUTPATIENT PHYSICAL THERAPY TREATMENT   Patient Name: Stacie Gibson MRN: [id number] DOB:January 21, 1975, 48 y.o., female Today's Date: 09/17/2023  END OF SESSION:  PT End of Session - 09/17/23 1308     Visit Number 3    Number of Visits 17    Date for PT Re-Evaluation 10/22/23    Authorization Type [employer]    PT Start Time 1315    PT Stop Time 1355    PT Time Calculation (min) 40 min    Activity Tolerance Patient tolerated treatment well    Behavior During Therapy WFL for tasks assessed/performed            Past Medical History:  Diagnosis Date   Anxiety    Chest pain    Excessive sweating    Hypertension    Hypokalemia    Iron  deficiency anemia    Pharyngitis    PTSD (post-traumatic stress disorder)    Symptomatic anemia    Thyromegaly    History reviewed. No pertinent surgical history. Patient Active Problem List   Diagnosis Date Noted   Irregular menstruation 09/16/2023   Polyarthralgia 05/30/2023   Iron  deficiency anemia 03/02/2014   Symptomatic anemia 03/01/2014   Hypokalemia 03/01/2014   Chest pain 10/02/2010   Hypertension 10/02/2010   Thyromegaly 10/02/2010    PCP: Lorren Greig PARAS, NP  REFERRING PROVIDER: Persons, Ronal Dragon, PA  REFERRING DIAG: [phone] (ICD-10-CM) - Acute pain of both knees   THERAPY DIAG:  Pain in both knees, unspecified chronicity  Muscle weakness (generalized)  Other abnormalities of gait and mobility  Rationale for Evaluation and Treatment: Rehabilitation  ONSET DATE: May 2025  SUBJECTIVE:   SUBJECTIVE STATEMENT: 09/17/2023: Pt presents to PT with reports of 5/10 bilateral knee pain. Has been compliant with HEP.   EVAL: Pt presents to PT with reports of recent onset of bilateral knee pain. She notes that pain only occurs when she sits for longer that 30 minutes. No trauma or MOI, pain does not occur when she is walking or moving. Sometimes her L knee will hurt in the middle of the night and she has to change  sleeping position. Denies N/T or any other sensation change down LE.   PERTINENT HISTORY: HTN  PAIN:  Are you having pain?  Yes: NPRS scale: 5/10 Worst: 10/10 Pain location: bilateral knee, lower joint line Pain description: sharp, sore Aggravating factors: sitting Relieving factors: movement  PRECAUTIONS: None  RED FLAGS: None   WEIGHT BEARING RESTRICTIONS: No  FALLS:  Has patient fallen in last 6 months? No  LIVING ENVIRONMENT: Lives with: lives alone Lives in: House/apartment Stairs: No  OCCUPATION: Home Health Aide  PLOF: Independent  PATIENT GOALS: decrease knee pain when sitting  NEXT MD VISIT: PRN  OBJECTIVE:  Note: Objective measures were completed at Evaluation unless otherwise noted.  DIAGNOSTIC FINDINGS: See imaging   PATIENT SURVEYS:  LEFS  Extreme difficulty/unable (0), Quite a bit of difficulty (1), Moderate difficulty (2), Little difficulty (3), No difficulty (4) Survey date:  08/27/2023  Any of your usual work, housework or school activities 4  2. Usual hobbies, recreational or sporting activities 4  3. Getting into/out of the bath 4  4. Walking between rooms 3  5. Putting on socks/shoes 3  6. Squatting  0  7. Lifting an object, like a bag of groceries from the floor 4  8. Performing light activities around your home 4  9. Performing heavy activities around your home 4  10. Getting into/out of a  car 3  11. Walking 2 blocks 4  12. Walking 1 mile 4  13. Going up/down 10 stairs (1 flight) 3  14. Standing for 1 hour 4  15.  sitting for 1 hour 1  16. Running on even ground 0  17. Running on uneven ground 0  18. Making sharp turns while running fast 0  19. Hopping  0  20. Rolling over in bed 4  Score total:  56/80     COGNITION: Overall cognitive status: Within functional limits for tasks assessed     SENSATION: WFL  EDEMA:  DNT   POSTURE: medium body habitus  PALPATION: TTP to bilateral joint line, patellar  hypomobility  LOWER EXTREMITY ROM:  Active ROM Right eval Left eval  Hip flexion    Hip extension    Hip abduction    Hip adduction    Hip internal rotation    Hip external rotation    Knee flexion 122 118  Knee extension 0 0  Ankle dorsiflexion    Ankle plantarflexion    Ankle inversion    Ankle eversion     (Blank rows = not tested)  LOWER EXTREMITY MMT:  MMT Right eval Left eval  Hip flexion 4 3+  Hip extension    Hip abduction 4 3  Hip adduction    Hip internal rotation    Hip external rotation    Knee flexion 4 4  Knee extension 4 3+  Ankle dorsiflexion    Ankle plantarflexion    Ankle inversion    Ankle eversion     (Blank rows = not tested)  LOWER EXTREMITY SPECIAL TESTS:  Knee special tests: Patellafemoral grind test: negative  FUNCTIONAL TESTS:  30 Second Sit to Stand: 16 reps  GAIT: Distance walked: 28ft Assistive device utilized: None Level of assistance: Complete Independence Comments: no overt gait deviations  TREATMENT: [facility] Adult PT Treatment:                                                DATE: 09/17/23 Therapeutic Exercise:  Supine QS x 10 - 5 hold Supine SLR 2x10 Banded bridge 3x10 RTB S/L clamshell 2x15 RTB LAQ 3x10 2.5lb Lateral walk RTB x 3 laps at counter Standing hip abd/ext 2x10 each RTB Mini squat BUE support 2x10  Step ups fwd x 10 8in 1 UE STS 2x10 10lb KB   [facility] Adult PT Treatment:                                                DATE: 09/03/23 MTPR along the L vastus lateralis LAQ with ball squeeze 2 x 20 LLE only STW along the R vastus using tennis ball Sidelying hip abduction 2 x 12  Sit to stand with GTB around the knees 2 x 10 Stair training on 6 inch steps x 4 Step downs on 6 inch step 2 x 10 Reviewed/ updated HEP  [facility] Adult PT Treatment:                                                DATE:  08/27/2023 Therapeutic Exercise: Supine QS x 5 - 5 hold SLR x 5 ea S/L hip abd x 5 ea LAQ x 5 ea STS x 10    PATIENT EDUCATION:  Education details: HEP update Person educated: Patient Education method: Explanation, Demonstration, and Handouts Education comprehension: verbalized understanding and returned demonstration  HOME EXERCISE PROGRAM: Access Code: [email or web address] URL: [email or web address] Date: 09/17/2023 Prepared by: Alm Kingdom  Exercises - Supine Quad Set  - 1 x daily - 7 x weekly - 3 sets - 10 reps - 5 sec hold - Active Straight Leg Raise with Quad Set  - 1 x daily - 7 x weekly - 3 sets - 10 reps - Sidelying Hip Abduction  - 1 x daily - 7 x weekly - 3 sets - 10 reps - Seated Long Arc Quad  - 1 x daily - 7 x weekly - 3 sets - 10 reps - 3-5 sec hold - Sit to Stand Without Arm Support  - 1 x daily - 7 x weekly - 2 sets - 10 reps - Seated Long Arc Quad with Hip Adduction  - 1 x daily - 7 x weekly - 2 sets - 10 reps - Side Stepping with Resistance at Ankles and Counter Support  - 1 x daily - 7 x weekly - 3 reps - red band hold - Standing Hip Abduction with Resistance at Ankles and Counter Support  - 1 x daily - 7 x weekly - 3 sets - 10 reps - red band hold - Standing Hip Extension with Resistance at Ankles and Counter Support  - 1 x daily - 7 x weekly - 3 sets - 10 reps - red band hold  ASSESSMENT:  CLINICAL IMPRESSION: 09/17/2023: Pt was able to complete all prescribed exercises with no adverse effect. Today we focused on continued strengthening of LE, with particular emphasis on quad and proximal hip. HEP was updated for continued progression of proximal hip strength. She continues to benefit from skilled PT services, will progress as able per POC.   Evaluation: Patient is a 48 y.o. F who was seen today for physical therapy evaluation and treatment for bilateral knee pain. Physical findings are consistent with referring provider impression as pt demonstrates decrease in bilateral LE strength and patellar hypomobility. LEFS score shows she is operating below baseline PLOF for  higher level mobility and home ADLs. She would benefit from skilled PT services working specifically on improving quad and lateral hip strength in order to decrease pain and improve function.  OBJECTIVE IMPAIRMENTS: Abnormal gait, decreased activity tolerance, decreased mobility, difficulty walking, decreased strength, and pain.   ACTIVITY LIMITATIONS: carrying, lifting, standing, squatting, stairs, transfers, and locomotion level  PARTICIPATION LIMITATIONS: community activity, occupation, and yard work  PERSONAL FACTORS: 1-2 comorbidities: HTN are also affecting patient's functional outcome.   REHAB POTENTIAL: Excellent  CLINICAL DECISION MAKING: Stable/uncomplicated  EVALUATION COMPLEXITY: Low   GOALS: Goals reviewed with patient? No  SHORT TERM GOALS: Target date: 09/17/2023   Pt will be compliant and knowledgeable with initial HEP for improved comfort and carryover Baseline: initial HEP given  Goal status: INITIAL  2.  Pt will self report bilateral knee pain no greater than 7/10 for improved comfort and functional ability Baseline: 10/10 at worst Goal status: INITIAL   LONG TERM GOALS: Target date: 10/22/2023   Pt will improve LEFS to no less than 65/80 as proxy for functional improvement with home ADLs and higher level community activity Baseline: 56/80 Goal status: INITIAL  2.  Pt will self report bilateral knee pain no greater than 3/10 for improved comfort and functional ability Baseline: 10/10 at worst Goal status: INITIAL   3.  Pt will improve all LE MMT to no less than 4/5 for improved knee stability and decreased pain Baseline: see MMT chart Goal status: INITIAL  4.  Pt will be able to sit >30 minutes without increase in bilateral knee pain for improved comfort and function Baseline: unable Goal status: INITIAL   PLAN:  PT FREQUENCY: 1-2x/week  PT DURATION: 8 weeks  PLANNED INTERVENTIONS: 97164- PT Re-evaluation, 97110-Therapeutic exercises, 97530-  Therapeutic activity, 97112- Neuromuscular re-education, 97535- Self Care, [id number]- Manual therapy, [id number]- Gait training, [phone]- Aquatic Therapy, [phone]- Electrical stimulation (unattended), [id number]- Electrical stimulation (manual), 20560 (1-2 muscles), 20561 (3+ muscles)- Dry Needling, Cryotherapy, and Moist heat  PLAN FOR NEXT SESSION: assess HEP response, quad and hip strengthening  Alm JAYSON Kingdom PT  09/17/23 1:58 PM

## 2023-09-18 LAB — NUSWAB VAGINITIS PLUS (VG+)
Candida albicans, NAA: NEGATIVE
Candida glabrata, NAA: NEGATIVE

## 2023-09-18 LAB — RPR, QUANT+TP ABS (REFLEX)
Rapid Plasma Reagin, Quant: 1:1 {titer} — ABNORMAL HIGH
T Pallidum Abs: NONREACTIVE

## 2023-09-18 LAB — HEPB+HEPC+HIV PANEL
HIV Screen 4th Generation wRfx: NONREACTIVE
Hep B C IgM: NEGATIVE
Hep B Core Total Ab: NEGATIVE
Hep B E Ab: NONREACTIVE
Hep B E Ag: NEGATIVE
Hep B Surface Ab, Qual: NONREACTIVE
Hep C Virus Ab: NONREACTIVE
Hepatitis B Surface Ag: NEGATIVE

## 2023-09-18 LAB — RPR: RPR Ser Ql: REACTIVE — AB

## 2023-09-22 ENCOUNTER — Ambulatory Visit: Payer: Self-pay | Admitting: Nurse Practitioner

## 2023-09-24 ENCOUNTER — Ambulatory Visit: Payer: MEDICAID

## 2023-09-24 DIAGNOSIS — R2689 Other abnormalities of gait and mobility: Secondary | ICD-10-CM

## 2023-09-24 DIAGNOSIS — M25561 Pain in right knee: Secondary | ICD-10-CM | POA: Diagnosis not present

## 2023-09-24 DIAGNOSIS — M6281 Muscle weakness (generalized): Secondary | ICD-10-CM

## 2023-09-24 DIAGNOSIS — M25562 Pain in left knee: Secondary | ICD-10-CM

## 2023-09-24 NOTE — Therapy (Signed)
 OUTPATIENT PHYSICAL THERAPY TREATMENT   Patient Name: Stacie Gibson MRN: [id number] DOB:May 02, 1975, 48 y.o., female Today's Date: 09/24/2023  END OF SESSION:  PT End of Session - 09/24/23 1250     Visit Number 4    Number of Visits 17    Date for PT Re-Evaluation 10/22/23    Authorization Type [employer]    Authorization Time Period Approved 11 PT visits from 08/27/23-10/11/2023    PT Start Time 1315    PT Stop Time 1355    PT Time Calculation (min) 40 min    Activity Tolerance Patient tolerated treatment well    Behavior During Therapy WFL for tasks assessed/performed            Past Medical History:  Diagnosis Date   Anxiety    Chest pain    Excessive sweating    Hypertension    Hypokalemia    Iron  deficiency anemia    Pharyngitis    PTSD (post-traumatic stress disorder)    Symptomatic anemia    Thyromegaly    History reviewed. No pertinent surgical history. Patient Active Problem List   Diagnosis Date Noted   Irregular menstruation 09/16/2023   Polyarthralgia 05/30/2023   Iron  deficiency anemia 03/02/2014   Symptomatic anemia 03/01/2014   Hypokalemia 03/01/2014   Chest pain 10/02/2010   Hypertension 10/02/2010   Thyromegaly 10/02/2010    PCP: Lorren Greig PARAS, NP  REFERRING PROVIDER: Persons, Ronal Dragon, PA  REFERRING DIAG: [phone] (ICD-10-CM) - Acute pain of both knees   THERAPY DIAG:  Pain in both knees, unspecified chronicity  Muscle weakness (generalized)  Other abnormalities of gait and mobility  Rationale for Evaluation and Treatment: Rehabilitation  ONSET DATE: May 2025  SUBJECTIVE:   SUBJECTIVE STATEMENT: 09/24/2023: Pt presents to PT   EVAL: Pt presents to PT with reports of recent onset of bilateral knee pain. She notes that pain only occurs when she sits for longer that 30 minutes. No trauma or MOI, pain does not occur when she is walking or moving. Sometimes her L knee will hurt in the middle of the night and she has to change  sleeping position. Denies N/T or any other sensation change down LE.   PERTINENT HISTORY: HTN  PAIN:  Are you having pain?  Yes: NPRS scale: 5/10 Worst: 10/10 Pain location: bilateral knee, lower joint line Pain description: sharp, sore Aggravating factors: sitting Relieving factors: movement  PRECAUTIONS: None  RED FLAGS: None   WEIGHT BEARING RESTRICTIONS: No  FALLS:  Has patient fallen in last 6 months? No  LIVING ENVIRONMENT: Lives with: lives alone Lives in: House/apartment Stairs: No  OCCUPATION: Home Health Aide  PLOF: Independent  PATIENT GOALS: decrease knee pain when sitting  NEXT MD VISIT: PRN  OBJECTIVE:  Note: Objective measures were completed at Evaluation unless otherwise noted.  DIAGNOSTIC FINDINGS: See imaging   PATIENT SURVEYS:  LEFS  Extreme difficulty/unable (0), Quite a bit of difficulty (1), Moderate difficulty (2), Little difficulty (3), No difficulty (4) Survey date:  08/27/2023  Any of your usual work, housework or school activities 4  2. Usual hobbies, recreational or sporting activities 4  3. Getting into/out of the bath 4  4. Walking between rooms 3  5. Putting on socks/shoes 3  6. Squatting  0  7. Lifting an object, like a bag of groceries from the floor 4  8. Performing light activities around your home 4  9. Performing heavy activities around your home 4  10. Getting into/out of a  car 3  11. Walking 2 blocks 4  12. Walking 1 mile 4  13. Going up/down 10 stairs (1 flight) 3  14. Standing for 1 hour 4  15.  sitting for 1 hour 1  16. Running on even ground 0  17. Running on uneven ground 0  18. Making sharp turns while running fast 0  19. Hopping  0  20. Rolling over in bed 4  Score total:  56/80     COGNITION: Overall cognitive status: Within functional limits for tasks assessed     SENSATION: WFL  EDEMA:  DNT   POSTURE: medium body habitus  PALPATION: TTP to bilateral joint line, patellar  hypomobility  LOWER EXTREMITY ROM:  Active ROM Right eval Left eval  Hip flexion    Hip extension    Hip abduction    Hip adduction    Hip internal rotation    Hip external rotation    Knee flexion 122 118  Knee extension 0 0  Ankle dorsiflexion    Ankle plantarflexion    Ankle inversion    Ankle eversion     (Blank rows = not tested)  LOWER EXTREMITY MMT:  MMT Right eval Left eval  Hip flexion 4 3+  Hip extension    Hip abduction 4 3  Hip adduction    Hip internal rotation    Hip external rotation    Knee flexion 4 4  Knee extension 4 3+  Ankle dorsiflexion    Ankle plantarflexion    Ankle inversion    Ankle eversion     (Blank rows = not tested)  LOWER EXTREMITY SPECIAL TESTS:  Knee special tests: Patellafemoral grind test: negative  FUNCTIONAL TESTS:  30 Second Sit to Stand: 16 reps  GAIT: Distance walked: 21ft Assistive device utilized: None Level of assistance: Complete Independence Comments: no overt gait deviations  TREATMENT: [facility] Adult PT Treatment:                                                DATE: 09/24/23 Supine QS x 10 - 5 hold Supine SLR 2x10 2lb S/L hip abd 2x10 2lb Banded bridge 3x10 GTB Lateral walk RTB x 3 laps at counter Standing hip abd/ext 2x10 ea RTB Mini squat BUE support 2x10  LAQ 3x10 4lb STS 2x10 10lb KB   [facility] Adult PT Treatment:                                                DATE: 09/17/23 Therapeutic Exercise:  Supine QS x 10 - 5 hold Supine SLR 2x10 Banded bridge 3x10 RTB S/L clamshell 2x15 RTB LAQ 3x10 2.5lb Lateral walk RTB x 3 laps at counter Standing hip abd/ext 2x10 each RTB Mini squat BUE support 2x10  Step ups fwd x 10 8in 1 UE STS 2x10 10lb KB   [facility] Adult PT Treatment:                                                DATE: 09/03/23 MTPR along the L vastus lateralis LAQ with ball squeeze 2 x  20 LLE only STW along the R vastus using tennis ball Sidelying hip abduction 2 x 12  Sit to stand with GTB  around the knees 2 x 10 Stair training on 6 inch steps x 4 Step downs on 6 inch step 2 x 10 Reviewed/ updated HEP  [facility] Adult PT Treatment:                                                DATE: 08/27/2023 Therapeutic Exercise: Supine QS x 5 - 5 hold SLR x 5 ea S/L hip abd x 5 ea LAQ x 5 ea STS x 10   PATIENT EDUCATION:  Education details: HEP update Person educated: Patient Education method: Explanation, Demonstration, and Handouts Education comprehension: verbalized understanding and returned demonstration  HOME EXERCISE PROGRAM: Access Code: [email or web address] URL: [email or web address] Date: 09/17/2023 Prepared by: Alm Kingdom  Exercises - Supine Quad Set  - 1 x daily - 7 x weekly - 3 sets - 10 reps - 5 sec hold - Active Straight Leg Raise with Quad Set  - 1 x daily - 7 x weekly - 3 sets - 10 reps - Sidelying Hip Abduction  - 1 x daily - 7 x weekly - 3 sets - 10 reps - Seated Long Arc Quad  - 1 x daily - 7 x weekly - 3 sets - 10 reps - 3-5 sec hold - Sit to Stand Without Arm Support  - 1 x daily - 7 x weekly - 2 sets - 10 reps - Seated Long Arc Quad with Hip Adduction  - 1 x daily - 7 x weekly - 2 sets - 10 reps - Side Stepping with Resistance at Ankles and Counter Support  - 1 x daily - 7 x weekly - 3 reps - red band hold - Standing Hip Abduction with Resistance at Ankles and Counter Support  - 1 x daily - 7 x weekly - 3 sets - 10 reps - red band hold - Standing Hip Extension with Resistance at Ankles and Counter Support  - 1 x daily - 7 x weekly - 3 sets - 10 reps - red band hold  ASSESSMENT:  CLINICAL IMPRESSION: 09/24/2023: Pt was able to complete all prescribed exercises with no adverse effect. Today we focused on continued strengthening of LE, with particular emphasis on quad and proximal hip. She continues to benefit from skilled PT services, will progress as able per POC.   Evaluation: Patient is a 48 y.o. F who was seen today for physical therapy evaluation  and treatment for bilateral knee pain. Physical findings are consistent with referring provider impression as pt demonstrates decrease in bilateral LE strength and patellar hypomobility. LEFS score shows she is operating below baseline PLOF for higher level mobility and home ADLs. She would benefit from skilled PT services working specifically on improving quad and lateral hip strength in order to decrease pain and improve function.  OBJECTIVE IMPAIRMENTS: Abnormal gait, decreased activity tolerance, decreased mobility, difficulty walking, decreased strength, and pain.   ACTIVITY LIMITATIONS: carrying, lifting, standing, squatting, stairs, transfers, and locomotion level  PARTICIPATION LIMITATIONS: community activity, occupation, and yard work  PERSONAL FACTORS: 1-2 comorbidities: HTN are also affecting patient's functional outcome.   REHAB POTENTIAL: Excellent  CLINICAL DECISION MAKING: Stable/uncomplicated  EVALUATION COMPLEXITY: Low   GOALS: Goals reviewed with patient? No  SHORT TERM GOALS: Target  date: 09/17/2023   Pt will be compliant and knowledgeable with initial HEP for improved comfort and carryover Baseline: initial HEP given  Goal status: MET  2.  Pt will self report bilateral knee pain no greater than 7/10 for improved comfort and functional ability Baseline: 10/10 at worst 09/24/2023: 8/10  Goal status: IN PROGRESS   LONG TERM GOALS: Target date: 10/22/2023   Pt will improve LEFS to no less than 65/80 as proxy for functional improvement with home ADLs and higher level community activity Baseline: 56/80 Goal status: INITIAL   2.  Pt will self report bilateral knee pain no greater than 3/10 for improved comfort and functional ability Baseline: 10/10 at worst Goal status: INITIAL   3.  Pt will improve all LE MMT to no less than 4/5 for improved knee stability and decreased pain Baseline: see MMT chart Goal status: INITIAL  4.  Pt will be able to sit >30 minutes  without increase in bilateral knee pain for improved comfort and function Baseline: unable Goal status: INITIAL   PLAN:  PT FREQUENCY: 1-2x/week  PT DURATION: 8 weeks  PLANNED INTERVENTIONS: 97164- PT Re-evaluation, 97110-Therapeutic exercises, 97530- Therapeutic activity, 97112- Neuromuscular re-education, 97535- Self Care, 02859- Manual therapy, [id number]- Gait training, [phone]- Aquatic Therapy, [phone]- Electrical stimulation (unattended), [id number]- Electrical stimulation (manual), 20560 (1-2 muscles), 20561 (3+ muscles)- Dry Needling, Cryotherapy, and Moist heat  PLAN FOR NEXT SESSION: assess HEP response, quad and hip strengthening  Alm JAYSON Kingdom PT  09/24/23 1:58 PM

## 2023-10-08 ENCOUNTER — Ambulatory Visit: Payer: MEDICAID | Attending: Physician Assistant | Admitting: Physical Therapy

## 2023-10-08 ENCOUNTER — Encounter: Payer: Self-pay | Admitting: Physical Therapy

## 2023-10-08 DIAGNOSIS — M6281 Muscle weakness (generalized): Secondary | ICD-10-CM | POA: Insufficient documentation

## 2023-10-08 DIAGNOSIS — M25561 Pain in right knee: Secondary | ICD-10-CM | POA: Diagnosis present

## 2023-10-08 DIAGNOSIS — M25562 Pain in left knee: Secondary | ICD-10-CM | POA: Diagnosis present

## 2023-10-08 DIAGNOSIS — R2689 Other abnormalities of gait and mobility: Secondary | ICD-10-CM | POA: Diagnosis present

## 2023-10-08 NOTE — Therapy (Signed)
 OUTPATIENT PHYSICAL THERAPY TREATMENT   Patient Name: Stacie Gibson MRN: [id number] DOB:09/06/1975, 48 y.o., female Today's Date: 10/08/2023  END OF SESSION:  PT End of Session - 10/08/23 1317     Visit Number 5    Number of Visits 17    Date for Recertification  10/22/23    Authorization Type [employer]    Authorization Time Period Approved 11 PT visits from 08/27/23-10/11/2023    PT Start Time 1315    PT Stop Time 1355    PT Time Calculation (min) 40 min            Past Medical History:  Diagnosis Date   Anxiety    Chest pain    Excessive sweating    Hypertension    Hypokalemia    Iron  deficiency anemia    Pharyngitis    PTSD (post-traumatic stress disorder)    Symptomatic anemia    Thyromegaly    History reviewed. No pertinent surgical history. Patient Active Problem List   Diagnosis Date Noted   Irregular menstruation 09/16/2023   Polyarthralgia 05/30/2023   Iron  deficiency anemia 03/02/2014   Symptomatic anemia 03/01/2014   Hypokalemia 03/01/2014   Chest pain 10/02/2010   Hypertension 10/02/2010   Thyromegaly 10/02/2010    PCP: Lorren Greig PARAS, NP  REFERRING PROVIDER: Persons, Ronal Dragon, PA  REFERRING DIAG: [phone] (ICD-10-CM) - Acute pain of both knees   THERAPY DIAG:  Pain in both knees, unspecified chronicity  Muscle weakness (generalized)  Other abnormalities of gait and mobility  Rationale for Evaluation and Treatment: Rehabilitation  ONSET DATE: May 2025  SUBJECTIVE:   SUBJECTIVE STATEMENT: 10/08/2023: Pt presents to PT and reports the left is hurts more. I do a lot of walking. It hurts more when I sit down. Pain doesn't reach 10/10 anymore.   EVAL: Pt presents to PT with reports of recent onset of bilateral knee pain. She notes that pain only occurs when she sits for longer that 30 minutes. No trauma or MOI, pain does not occur when she is walking or moving. Sometimes her L knee will hurt in the middle of the night and she has to  change sleeping position. Denies N/T or any other sensation change down LE.   PERTINENT HISTORY: HTN  PAIN:  Are you having pain?  Yes: NPRS scale: 5/10 Worst: 8/10 Pain location: bilateral knee, lower joint line Pain description: sharp, sore Aggravating factors: sitting Relieving factors: movement  PRECAUTIONS: None  RED FLAGS: None   WEIGHT BEARING RESTRICTIONS: No  FALLS:  Has patient fallen in last 6 months? No  LIVING ENVIRONMENT: Lives with: lives alone Lives in: House/apartment Stairs: No  OCCUPATION: Home Health Aide  PLOF: Independent  PATIENT GOALS: decrease knee pain when sitting  NEXT MD VISIT: PRN  OBJECTIVE:  Note: Objective measures were completed at Evaluation unless otherwise noted.  DIAGNOSTIC FINDINGS: See imaging   PATIENT SURVEYS:  LEFS  Extreme difficulty/unable (0), Quite a bit of difficulty (1), Moderate difficulty (2), Little difficulty (3), No difficulty (4) Survey date:  08/27/2023  Any of your usual work, housework or school activities 4  2. Usual hobbies, recreational or sporting activities 4  3. Getting into/out of the bath 4  4. Walking between rooms 3  5. Putting on socks/shoes 3  6. Squatting  0  7. Lifting an object, like a bag of groceries from the floor 4  8. Performing light activities around your home 4  9. Performing heavy activities around your home 4  10. Getting into/out of a car 3  11. Walking 2 blocks 4  12. Walking 1 mile 4  13. Going up/down 10 stairs (1 flight) 3  14. Standing for 1 hour 4  15.  sitting for 1 hour 1  16. Running on even ground 0  17. Running on uneven ground 0  18. Making sharp turns while running fast 0  19. Hopping  0  20. Rolling over in bed 4  Score total:  56/80     COGNITION: Overall cognitive status: Within functional limits for tasks assessed     SENSATION: WFL  EDEMA:  DNT   POSTURE: medium body habitus  PALPATION: TTP to bilateral joint line, patellar  hypomobility  LOWER EXTREMITY ROM:  Active ROM Right eval Left eval  Hip flexion    Hip extension    Hip abduction    Hip adduction    Hip internal rotation    Hip external rotation    Knee flexion 122 118  Knee extension 0 0  Ankle dorsiflexion    Ankle plantarflexion    Ankle inversion    Ankle eversion     (Blank rows = not tested)  LOWER EXTREMITY MMT:  MMT Right eval Left eval Left 10/08/23  Hip flexion 4 3+ 4  Hip extension     Hip abduction 4 3   Hip adduction     Hip internal rotation     Hip external rotation     Knee flexion 4 4 4+  Knee extension 4 3+ 4+  Ankle dorsiflexion     Ankle plantarflexion     Ankle inversion     Ankle eversion      (Blank rows = not tested)  LOWER EXTREMITY SPECIAL TESTS:  Knee special tests: Patellafemoral grind test: negative  FUNCTIONAL TESTS:  30 Second Sit to Stand: 16 reps   GAIT: Distance walked: 70ft Assistive device utilized: None Level of assistance: Complete Independence Comments: no overt gait deviations  TREATMENT: [facility] Adult PT Treatment:                                                DATE: 10/08/23 Supine QS x 10 - 5 hold Supine SLR 2x10 2lb S/L hip abd 2x10 2lb Banded bridge 3x10 GTB Lateral walk RTB x 3 laps at counter Standing hip abd/ext 2x10 ea RTB Squat  BUE support 2x10  LAQ 2x10 5lb 8 inch step up 10 x 2 each  4 inch step down x 10 each  STS 2x10 15lb KB    [facility] Adult PT Treatment:                                                DATE: 09/24/23 Supine QS x 10 - 5 hold Supine SLR 2x10 2lb S/L hip abd 2x10 2lb Banded bridge 3x10 GTB Lateral walk RTB x 3 laps at counter Standing hip abd/ext 2x10 ea RTB Mini squat BUE support 2x10  LAQ 3x10 4lb STS 2x10 10lb KB   [facility] Adult PT Treatment:  DATE: 09/17/23 Therapeutic Exercise:  Supine QS x 10 - 5 hold Supine SLR 2x10 Banded bridge 3x10 RTB S/L clamshell 2x15 RTB LAQ 3x10  2.5lb Lateral walk RTB x 3 laps at counter Standing hip abd/ext 2x10 each RTB Mini squat BUE support 2x10  Step ups fwd x 10 8in 1 UE STS 2x10 10lb KB   [facility] Adult PT Treatment:                                                DATE: 09/03/23 MTPR along the L vastus lateralis LAQ with ball squeeze 2 x 20 LLE only STW along the R vastus using tennis ball Sidelying hip abduction 2 x 12  Sit to stand with GTB around the knees 2 x 10 Stair training on 6 inch steps x 4 Step downs on 6 inch step 2 x 10 Reviewed/ updated HEP  [facility] Adult PT Treatment:                                                DATE: 08/27/2023 Therapeutic Exercise: Supine QS x 5 - 5 hold SLR x 5 ea S/L hip abd x 5 ea LAQ x 5 ea STS x 10   PATIENT EDUCATION:  Education details: HEP update Person educated: Patient Education method: Explanation, Demonstration, and Handouts Education comprehension: verbalized understanding and returned demonstration  HOME EXERCISE PROGRAM: Access Code: [email or web address] URL: [email or web address] Date: 09/17/2023 Prepared by: Alm Kingdom  Exercises - Supine Quad Set  - 1 x daily - 7 x weekly - 3 sets - 10 reps - 5 sec hold - Active Straight Leg Raise with Quad Set  - 1 x daily - 7 x weekly - 3 sets - 10 reps - Sidelying Hip Abduction  - 1 x daily - 7 x weekly - 3 sets - 10 reps - Seated Long Arc Quad  - 1 x daily - 7 x weekly - 3 sets - 10 reps - 3-5 sec hold - Sit to Stand Without Arm Support  - 1 x daily - 7 x weekly - 2 sets - 10 reps - Seated Long Arc Quad with Hip Adduction  - 1 x daily - 7 x weekly - 2 sets - 10 reps - Side Stepping with Resistance at Ankles and Counter Support  - 1 x daily - 7 x weekly - 3 reps - red band hold - Standing Hip Abduction with Resistance at Ankles and Counter Support  - 1 x daily - 7 x weekly - 3 sets - 10 reps - red band hold - Standing Hip Extension with Resistance at Ankles and Counter Support  - 1 x daily - 7 x weekly - 3 sets - 10 reps  - red band hold  ASSESSMENT:  CLINICAL IMPRESSION: 10/08/2023: Pt was able to complete all prescribed exercises with no adverse effect. Today we focused on continued strengthening of LE, with particular emphasis on quad and proximal hip. She is able to sit 30 minutes comfortably prior to increased knee pain. Her worst pain level is 8/10 which is improved from 10/10.  She continues to benefit from skilled PT services, will progress as able per POC.   Evaluation: Patient is a 48 y.o. F  who was seen today for physical therapy evaluation and treatment for bilateral knee pain. Physical findings are consistent with referring provider impression as pt demonstrates decrease in bilateral LE strength and patellar hypomobility. LEFS score shows she is operating below baseline PLOF for higher level mobility and home ADLs. She would benefit from skilled PT services working specifically on improving quad and lateral hip strength in order to decrease pain and improve function.  OBJECTIVE IMPAIRMENTS: Abnormal gait, decreased activity tolerance, decreased mobility, difficulty walking, decreased strength, and pain.   ACTIVITY LIMITATIONS: carrying, lifting, standing, squatting, stairs, transfers, and locomotion level  PARTICIPATION LIMITATIONS: community activity, occupation, and yard work  PERSONAL FACTORS: 1-2 comorbidities: HTN are also affecting patient's functional outcome.   REHAB POTENTIAL: Excellent  CLINICAL DECISION MAKING: Stable/uncomplicated  EVALUATION COMPLEXITY: Low   GOALS: Goals reviewed with patient? No  SHORT TERM GOALS: Target date: 09/17/2023   Pt will be compliant and knowledgeable with initial HEP for improved comfort and carryover Baseline: initial HEP given  Goal status: MET  2.  Pt will self report bilateral knee pain no greater than 7/10 for improved comfort and functional ability Baseline: 10/10 at worst 09/24/2023: 8/10  10/08/23: 8/10 Goal status: IN PROGRESS   LONG  TERM GOALS: Target date: 10/22/2023   Pt will improve LEFS to no less than 65/80 as proxy for functional improvement with home ADLs and higher level community activity Baseline: 56/80 Goal status: INITIAL   2.  Pt will self report bilateral knee pain no greater than 3/10 for improved comfort and functional ability Baseline: 10/10 at worst Goal status: INITIAL   3.  Pt will improve all LE MMT to no less than 4/5 for improved knee stability and decreased pain Baseline: see MMT chart Goal status: INITIAL  4.  Pt will be able to sit >30 minutes without increase in bilateral knee pain for improved comfort and function Baseline: unable 10/08/23: 30 min max Goal status: ONGOING   PLAN:  PT FREQUENCY: 1-2x/week  PT DURATION: 8 weeks  PLANNED INTERVENTIONS: 97164- PT Re-evaluation, 97110-Therapeutic exercises, 97530- Therapeutic activity, 97112- Neuromuscular re-education, 97535- Self Care, 02859- Manual therapy, [id number]- Gait training, [phone]- Aquatic Therapy, [phone]- Electrical stimulation (unattended), [id number]- Electrical stimulation (manual), 20560 (1-2 muscles), 20561 (3+ muscles)- Dry Needling, Cryotherapy, and Moist heat  PLAN FOR NEXT SESSION: assess HEP response, quad and hip strengthening  Harlene CHRISTELLA Persons PTA  10/08/23 2:42 PM

## 2023-10-15 ENCOUNTER — Ambulatory Visit: Payer: MEDICAID

## 2023-10-15 DIAGNOSIS — R2689 Other abnormalities of gait and mobility: Secondary | ICD-10-CM

## 2023-10-15 DIAGNOSIS — M25561 Pain in right knee: Secondary | ICD-10-CM | POA: Diagnosis not present

## 2023-10-15 DIAGNOSIS — M6281 Muscle weakness (generalized): Secondary | ICD-10-CM

## 2023-10-15 NOTE — Therapy (Signed)
 OUTPATIENT PHYSICAL THERAPY TREATMENT   Patient Name: Stacie Gibson MRN: [id number] DOB:[birth year], 48 y.o., female Today's Date: 10/15/2023  END OF SESSION:  PT End of Session - 10/15/23 1309     Visit Number 6    Number of Visits 17    Date for Recertification  10/22/23    Authorization Type [employer]    Authorization Time Period Approved 11 PT visits from 08/27/23-10/11/2023    PT Start Time 1315    PT Stop Time 1355    PT Time Calculation (min) 40 min    Activity Tolerance Patient tolerated treatment well    Behavior During Therapy WFL for tasks assessed/performed             Past Medical History:  Diagnosis Date   Anxiety    Chest pain    Excessive sweating    Hypertension    Hypokalemia    Iron  deficiency anemia    Pharyngitis    PTSD (post-traumatic stress disorder)    Symptomatic anemia    Thyromegaly    History reviewed. No pertinent surgical history. Patient Active Problem List   Diagnosis Date Noted   Irregular menstruation 09/16/2023   Polyarthralgia 05/30/2023   Iron  deficiency anemia 03/02/2014   Symptomatic anemia 03/01/2014   Hypokalemia 03/01/2014   Chest pain 10/02/2010   Hypertension 10/02/2010   Thyromegaly 10/02/2010    PCP: Lorren Greig PARAS, NP  REFERRING PROVIDER: Persons, Ronal Dragon, PA  REFERRING DIAG: [phone] (ICD-10-CM) - Acute pain of both knees   THERAPY DIAG:  Pain in both knees, unspecified chronicity  Muscle weakness (generalized)  Other abnormalities of gait and mobility  Rationale for Evaluation and Treatment: Rehabilitation  ONSET DATE: May 2025  SUBJECTIVE:   SUBJECTIVE STATEMENT: Pt presents to PT with continued 4/10 L knee pain. Feels like she is getting stronger, still 9/10 pain at worst.   EVAL: Pt presents to PT with reports of recent onset of bilateral knee pain. She notes that pain only occurs when she sits for longer that 30 minutes. No trauma or MOI, pain does not occur when she is walking or  moving. Sometimes her L knee will hurt in the middle of the night and she has to change sleeping position. Denies N/T or any other sensation change down LE.   PERTINENT HISTORY: HTN  PAIN:  Are you having pain?  Yes: NPRS scale: 5/10 Worst: 8/10 Pain location: bilateral knee, lower joint line Pain description: sharp, sore Aggravating factors: sitting Relieving factors: movement  PRECAUTIONS: None  RED FLAGS: None   WEIGHT BEARING RESTRICTIONS: No  FALLS:  Has patient fallen in last 6 months? No  LIVING ENVIRONMENT: Lives with: lives alone Lives in: House/apartment Stairs: No  OCCUPATION: Home Health Aide  PLOF: Independent  PATIENT GOALS: decrease knee pain when sitting  NEXT MD VISIT: PRN  OBJECTIVE:  Note: Objective measures were completed at Evaluation unless otherwise noted.  DIAGNOSTIC FINDINGS: See imaging   PATIENT SURVEYS:  LEFS  Extreme difficulty/unable (0), Quite a bit of difficulty (1), Moderate difficulty (2), Little difficulty (3), No difficulty (4) Survey date:  08/27/2023  Any of your usual work, housework or school activities 4  2. Usual hobbies, recreational or sporting activities 4  3. Getting into/out of the bath 4  4. Walking between rooms 3  5. Putting on socks/shoes 3  6. Squatting  0  7. Lifting an object, like a bag of groceries from the floor 4  8. Performing light activities around your  home 4  9. Performing heavy activities around your home 4  10. Getting into/out of a car 3  11. Walking 2 blocks 4  12. Walking 1 mile 4  13. Going up/down 10 stairs (1 flight) 3  14. Standing for 1 hour 4  15.  sitting for 1 hour 1  16. Running on even ground 0  17. Running on uneven ground 0  18. Making sharp turns while running fast 0  19. Hopping  0  20. Rolling over in bed 4  Score total:  56/80     COGNITION: Overall cognitive status: Within functional limits for tasks assessed     SENSATION: WFL  EDEMA:  DNT   POSTURE:  medium body habitus  PALPATION: TTP to bilateral joint line, patellar hypomobility  LOWER EXTREMITY ROM:  Active ROM Right eval Left eval  Hip flexion    Hip extension    Hip abduction    Hip adduction    Hip internal rotation    Hip external rotation    Knee flexion 122 118  Knee extension 0 0  Ankle dorsiflexion    Ankle plantarflexion    Ankle inversion    Ankle eversion     (Blank rows = not tested)  LOWER EXTREMITY MMT:  MMT Right eval Left eval Left 10/08/23  Hip flexion 4 3+ 4  Hip extension     Hip abduction 4 3   Hip adduction     Hip internal rotation     Hip external rotation     Knee flexion 4 4 4+  Knee extension 4 3+ 4+  Ankle dorsiflexion     Ankle plantarflexion     Ankle inversion     Ankle eversion      (Blank rows = not tested)  LOWER EXTREMITY SPECIAL TESTS:  Knee special tests: Patellafemoral grind test: negative  FUNCTIONAL TESTS:  30 Second Sit to Stand: 16 reps   GAIT: Distance walked: 58ft Assistive device utilized: None Level of assistance: Complete Independence Comments: no overt gait deviations  TREATMENT: [facility] Adult PT Treatment:                                                DATE: 10/15/23 Supine QS x 10 - 5 hold Supine SLR 2x10 2lb S/L hip abd 2x10 2lb Banded bridge 3x10 GTB Hooklying clamshell 2x15 GTB Seated knee ext 2x10 10# Seated hamstring curl 2x10 25# Lateral walk RTB x 3 laps at counter Standing hip abd/ext 2x15 ea RTB Wall squat 2x10 8 inch step up 2x10 fwd STS 2x10 15lb KB   [facility] Adult PT Treatment:                                                DATE: 10/08/23 Supine QS x 10 - 5 hold Supine SLR 2x10 2lb S/L hip abd 2x10 2lb Banded bridge 3x10 GTB Lateral walk RTB x 3 laps at counter Standing hip abd/ext 2x10 ea RTB Squat  BUE support 2x10  LAQ 2x10 5lb 8 inch step up 10 x 2 each  4 inch step down x 10 each  STS 2x10 15lb KB    [facility] Adult PT Treatment:  DATE: 09/24/23 Supine QS x 10 - 5 hold Supine SLR 2x10 2lb S/L hip abd 2x10 2lb Banded bridge 3x10 GTB Lateral walk RTB x 3 laps at counter Standing hip abd/ext 2x10 ea RTB Mini squat BUE support 2x10  LAQ 3x10 4lb STS 2x10 10lb KB   [facility] Adult PT Treatment:                                                DATE: 09/17/23 Therapeutic Exercise:  Supine QS x 10 - 5 hold Supine SLR 2x10 Banded bridge 3x10 RTB S/L clamshell 2x15 RTB LAQ 3x10 2.5lb Lateral walk RTB x 3 laps at counter Standing hip abd/ext 2x10 each RTB Mini squat BUE support 2x10  Step ups fwd x 10 8in 1 UE STS 2x10 10lb KB   [facility] Adult PT Treatment:                                                DATE: 09/03/23 MTPR along the L vastus lateralis LAQ with ball squeeze 2 x 20 LLE only STW along the R vastus using tennis ball Sidelying hip abduction 2 x 12  Sit to stand with GTB around the knees 2 x 10 Stair training on 6 inch steps x 4 Step downs on 6 inch step 2 x 10 Reviewed/ updated HEP  [facility] Adult PT Treatment:                                                DATE: 08/27/2023 Therapeutic Exercise: Supine QS x 5 - 5 hold SLR x 5 ea S/L hip abd x 5 ea LAQ x 5 ea STS x 10   PATIENT EDUCATION:  Education details: HEP update Person educated: Patient Education method: Explanation, Demonstration, and Handouts Education comprehension: verbalized understanding and returned demonstration  HOME EXERCISE PROGRAM: Access Code: [email or web address] URL: [email or web address] Date: 09/17/2023 Prepared by: Alm Kingdom  Exercises - Supine Quad Set  - 1 x daily - 7 x weekly - 3 sets - 10 reps - 5 sec hold - Active Straight Leg Raise with Quad Set  - 1 x daily - 7 x weekly - 3 sets - 10 reps - Sidelying Hip Abduction  - 1 x daily - 7 x weekly - 3 sets - 10 reps - Seated Long Arc Quad  - 1 x daily - 7 x weekly - 3 sets - 10 reps - 3-5 sec hold - Sit to Stand Without Arm Support  - 1 x daily - 7 x weekly - 2 sets  - 10 reps - Seated Long Arc Quad with Hip Adduction  - 1 x daily - 7 x weekly - 2 sets - 10 reps - Side Stepping with Resistance at Ankles and Counter Support  - 1 x daily - 7 x weekly - 3 reps - red band hold - Standing Hip Abduction with Resistance at Ankles and Counter Support  - 1 x daily - 7 x weekly - 3 sets - 10 reps - red band hold - Standing Hip Extension with Resistance at Ankles and Counter Support  -  1 x daily - 7 x weekly - 3 sets - 10 reps - red band hold  ASSESSMENT:  CLINICAL IMPRESSION: Pt was able to complete all prescribed exercises with no adverse effect. She has been progressing well with therapy showing improving strength and functional mobility. She continues to benefit from skilled PT services, will continue to progress per POC as prescribed.   Evaluation: Patient is a 48 y.o. F who was seen today for physical therapy evaluation and treatment for bilateral knee pain. Physical findings are consistent with referring provider impression as pt demonstrates decrease in bilateral LE strength and patellar hypomobility. LEFS score shows she is operating below baseline PLOF for higher level mobility and home ADLs. She would benefit from skilled PT services working specifically on improving quad and lateral hip strength in order to decrease pain and improve function.  OBJECTIVE IMPAIRMENTS: Abnormal gait, decreased activity tolerance, decreased mobility, difficulty walking, decreased strength, and pain.   ACTIVITY LIMITATIONS: carrying, lifting, standing, squatting, stairs, transfers, and locomotion level  PARTICIPATION LIMITATIONS: community activity, occupation, and yard work  PERSONAL FACTORS: 1-2 comorbidities: HTN are also affecting patient's functional outcome.   REHAB POTENTIAL: Excellent  CLINICAL DECISION MAKING: Stable/uncomplicated  EVALUATION COMPLEXITY: Low   GOALS: Goals reviewed with patient? No  SHORT TERM GOALS: Target date: 09/17/2023   Pt will be  compliant and knowledgeable with initial HEP for improved comfort and carryover Baseline: initial HEP given  Goal status: MET  2.  Pt will self report bilateral knee pain no greater than 7/10 for improved comfort and functional ability Baseline: 10/10 at worst 09/24/2023: 8/10  10/08/23: 8/10 10/15/2023: 9/10 at worst Goal status: IN PROGRESS   LONG TERM GOALS: Target date: 10/22/2023   Pt will improve LEFS to no less than 65/80 as proxy for functional improvement with home ADLs and higher level community activity Baseline: 56/80 Goal status: IN PROGRESS   2.  Pt will self report bilateral knee pain no greater than 3/10 for improved comfort and functional ability Baseline: 10/10 at worst 10/15/2023: 9/10 at worst Goal status: IN PROGRESS   3.  Pt will improve all LE MMT to no less than 4/5 for improved knee stability and decreased pain Baseline: see MMT chart Goal status: INITIAL  4.  Pt will be able to sit >30 minutes without increase in bilateral knee pain for improved comfort and function Baseline: unable 10/08/23: 30 min max Goal status: ONGOING   PLAN:  PT FREQUENCY: 1-2x/week  PT DURATION: 8 weeks  PLANNED INTERVENTIONS: 97164- PT Re-evaluation, 97110-Therapeutic exercises, 97530- Therapeutic activity, 97112- Neuromuscular re-education, 97535- Self Care, [id number]- Manual therapy, [id number]- Gait training, [phone]- Aquatic Therapy, [phone]- Electrical stimulation (unattended), [id number]- Electrical stimulation (manual), 20560 (1-2 muscles), 20561 (3+ muscles)- Dry Needling, Cryotherapy, and Moist heat  PLAN FOR NEXT SESSION: assess HEP response, quad and hip strengthening  Alm JAYSON Kingdom PT  10/15/23 3:35 PM

## 2023-10-22 ENCOUNTER — Encounter: Payer: Self-pay | Admitting: Physical Therapy

## 2023-10-22 ENCOUNTER — Ambulatory Visit: Payer: MEDICAID | Admitting: Physical Therapy

## 2023-10-22 DIAGNOSIS — R2689 Other abnormalities of gait and mobility: Secondary | ICD-10-CM

## 2023-10-22 DIAGNOSIS — M25561 Pain in right knee: Secondary | ICD-10-CM | POA: Diagnosis not present

## 2023-10-22 DIAGNOSIS — M6281 Muscle weakness (generalized): Secondary | ICD-10-CM

## 2023-10-22 NOTE — Therapy (Addendum)
 OUTPATIENT PHYSICAL THERAPY TREATMENT/DISCHARGE  PHYSICAL THERAPY DISCHARGE SUMMARY  Visits from Start of Care: 7  Current functional level related to goals / functional outcomes: See goals and objective   Remaining deficits: See goals and objective   Education / Equipment: HEP   Patient agrees to discharge. Patient goals were mostly met. Patient is being discharged due to being pleased with the current functional level.  Patient Name: Stacie Gibson MRN: [id number] DOB:May 17, 1975, 48 y.o., female Today's Date: 10/22/2023  END OF SESSION:  PT End of Session - 10/22/23 1237     Visit Number 7    Number of Visits 17    Date for Recertification  10/22/23    Authorization Type [employer]    Authorization Time Period Approved 11 PT visits from 08/27/23-10/11/2023    PT Start Time 1233    PT Stop Time 1305    PT Time Calculation (min) 32 min             Past Medical History:  Diagnosis Date   Anxiety    Chest pain    Excessive sweating    Hypertension    Hypokalemia    Iron  deficiency anemia    Pharyngitis    PTSD (post-traumatic stress disorder)    Symptomatic anemia    Thyromegaly    History reviewed. No pertinent surgical history. Patient Active Problem List   Diagnosis Date Noted   Irregular menstruation 09/16/2023   Polyarthralgia 05/30/2023   Iron  deficiency anemia 03/02/2014   Symptomatic anemia 03/01/2014   Hypokalemia 03/01/2014   Chest pain 10/02/2010   Hypertension 10/02/2010   Thyromegaly 10/02/2010    PCP: Lorren Greig PARAS, NP  REFERRING PROVIDER: Lorren Greig PARAS, NP  REFERRING DIAG: [phone] (ICD-10-CM) - Acute pain of both knees   THERAPY DIAG:  Pain in both knees, unspecified chronicity  Muscle weakness (generalized)  Other abnormalities of gait and mobility  Rationale for Evaluation and Treatment: Rehabilitation  ONSET DATE: May 2025  SUBJECTIVE:   SUBJECTIVE STATEMENT: Pain only with prolonged sitting and in the  mornings.   EVAL: Pt presents to PT with reports of recent onset of bilateral knee pain. She notes that pain only occurs when she sits for longer that 30 minutes. No trauma or MOI, pain does not occur when she is walking or moving. Sometimes her L knee will hurt in the middle of the night and she has to change sleeping position. Denies N/T or any other sensation change down LE.   PERTINENT HISTORY: HTN  PAIN:  Are you having pain?  Yes: NPRS scale: 5/10 Worst: 8/10 Pain location: bilateral knee, lower joint line Pain description: sharp, sore Aggravating factors: sitting Relieving factors: movement  PRECAUTIONS: None  RED FLAGS: None   WEIGHT BEARING RESTRICTIONS: No  FALLS:  Has patient fallen in last 6 months? No  LIVING ENVIRONMENT: Lives with: lives alone Lives in: House/apartment Stairs: No  OCCUPATION: Home Health Aide  PLOF: Independent  PATIENT GOALS: decrease knee pain when sitting  NEXT MD VISIT: PRN  OBJECTIVE:  Note: Objective measures were completed at Evaluation unless otherwise noted.  DIAGNOSTIC FINDINGS: See imaging   PATIENT SURVEYS:  LEFS  Extreme difficulty/unable (0), Quite a bit of difficulty (1), Moderate difficulty (2), Little difficulty (3), No difficulty (4) Survey date:  08/27/2023 10/22/23  Any of your usual work, housework or school activities 4 3  2. Usual hobbies, recreational or sporting activities 4   3. Getting into/out of the bath 4   4.  Walking between rooms 3   5. Putting on socks/shoes 3   6. Squatting  0 3  7. Lifting an object, like a bag of groceries from the floor 4   8. Performing light activities around your home 4   9. Performing heavy activities around your home 4   10. Getting into/out of a car 3   11. Walking 2 blocks 4   12. Walking 1 mile 4   13. Going up/down 10 stairs (1 flight) 3   14. Standing for 1 hour 4   15.  sitting for 1 hour 1 3  16. Running on even ground 0 3  17. Running on uneven ground 0 3   18. Making sharp turns while running fast 0   19. Hopping  0   20. Rolling over in bed 4   Score total:  56/80      COGNITION: Overall cognitive status: Within functional limits for tasks assessed     SENSATION: WFL  EDEMA:  DNT   POSTURE: medium body habitus  PALPATION: TTP to bilateral joint line, patellar hypomobility  LOWER EXTREMITY ROM:  Active ROM Right eval Left eval  Hip flexion    Hip extension    Hip abduction    Hip adduction    Hip internal rotation    Hip external rotation    Knee flexion 122 118  Knee extension 0 0  Ankle dorsiflexion    Ankle plantarflexion    Ankle inversion    Ankle eversion     (Blank rows = not tested)  LOWER EXTREMITY MMT:  MMT Right eval Left eval Left 10/08/23  Hip flexion 4 3+ 4  Hip extension     Hip abduction 4 3   Hip adduction     Hip internal rotation     Hip external rotation     Knee flexion 4 4 4+  Knee extension 4 3+ 4+  Ankle dorsiflexion     Ankle plantarflexion     Ankle inversion     Ankle eversion      (Blank rows = not tested)  LOWER EXTREMITY SPECIAL TESTS:  Knee special tests: Patellafemoral grind test: negative  FUNCTIONAL TESTS:  30 Second Sit to Stand: 16 reps   GAIT: Distance walked: 15ft Assistive device utilized: None Level of assistance: Complete Independence Comments: no overt gait deviations  TREATMENT: [facility] Adult PT Treatment:                                                DATE: 10/22/23  Therapeutic Exercise: Review of HEP  Therapeutic Activity: LEFS    [facility] Adult PT Treatment:                                                DATE: 10/15/23 Supine QS x 10 - 5 hold Supine SLR 2x10 2lb S/L hip abd 2x10 2lb Banded bridge 3x10 GTB Hooklying clamshell 2x15 GTB Seated knee ext 2x10 10# Seated hamstring curl 2x10 25# Lateral walk RTB x 3 laps at counter Standing hip abd/ext 2x15 ea RTB Wall squat 2x10 8 inch step up 2x10 fwd STS 2x10 15lb KB      PATIENT  EDUCATION:  Education details: HEP  update Person educated: Patient Education method: Explanation, Demonstration, and Handouts Education comprehension: verbalized understanding and returned demonstration  HOME EXERCISE PROGRAM: Access Code: [email or web address] URL: [email or web address] Date: 09/17/2023 Prepared by: Alm Kingdom  Exercises - Supine Quad Set  - 1 x daily - 7 x weekly - 3 sets - 10 reps - 5 sec hold - Active Straight Leg Raise with Quad Set  - 1 x daily - 7 x weekly - 3 sets - 10 reps - Sidelying Hip Abduction  - 1 x daily - 7 x weekly - 3 sets - 10 reps - Seated Long Arc Quad  - 1 x daily - 7 x weekly - 3 sets - 10 reps - 3-5 sec hold - Sit to Stand Without Arm Support  - 1 x daily - 7 x weekly - 2 sets - 10 reps - Seated Long Arc Quad with Hip Adduction  - 1 x daily - 7 x weekly - 2 sets - 10 reps - Side Stepping with Resistance at Ankles and Counter Support  - 1 x daily - 7 x weekly - 3 reps - red band hold - Standing Hip Abduction with Resistance at Ankles and Counter Support  - 1 x daily - 7 x weekly - 3 sets - 10 reps - red band hold - Standing Hip Extension with Resistance at Ankles and Counter Support  - 1 x daily - 7 x weekly - 3 sets - 10 reps - red band hold  ASSESSMENT:  CLINICAL IMPRESSION: Pt reports pain with prolonged sitting but has stiffness and pain in the morning time also. Otherwise, she reports overall improvement in intensity of pain. She can sit for approx 30 minutes. Pain intensity has decreased from 10/10 at worst to 5-6/10 at worst. Her MMT has improved to 4/5 to 4+/5 in LE. LEFS improved from to 56 to 75/80. She is independent with HEP. Based on LTG's and subjective reports of improvement, pt is appropriate and agreeable to DC to her HEP.     Evaluation: Patient is a 48 y.o. F who was seen today for physical therapy evaluation and treatment for bilateral knee pain. Physical findings are consistent with referring provider impression as pt  demonstrates decrease in bilateral LE strength and patellar hypomobility. LEFS score shows she is operating below baseline PLOF for higher level mobility and home ADLs. She would benefit from skilled PT services working specifically on improving quad and lateral hip strength in order to decrease pain and improve function.  OBJECTIVE IMPAIRMENTS: Abnormal gait, decreased activity tolerance, decreased mobility, difficulty walking, decreased strength, and pain.   ACTIVITY LIMITATIONS: carrying, lifting, standing, squatting, stairs, transfers, and locomotion level  PARTICIPATION LIMITATIONS: community activity, occupation, and yard work  PERSONAL FACTORS: 1-2 comorbidities: HTN are also affecting patient's functional outcome.   REHAB POTENTIAL: Excellent  CLINICAL DECISION MAKING: Stable/uncomplicated  EVALUATION COMPLEXITY: Low   GOALS: Goals reviewed with patient? No  SHORT TERM GOALS: Target date: 09/17/2023   Pt will be compliant and knowledgeable with initial HEP for improved comfort and carryover Baseline: initial HEP given  Goal status: MET  2.  Pt will self report bilateral knee pain no greater than 7/10 for improved comfort and functional ability Baseline: 10/10 at worst 09/24/2023: 8/10  10/08/23: 8/10 10/15/2023: 9/10 at worst 10/22/23: 5-6/10 Goal status: MET   LONG TERM GOALS: Target date: 10/22/2023   Pt will improve LEFS to no less than 65/80 as proxy for functional improvement with home ADLs and higher level community  activity Baseline: 56/80 10/22/23: 75/80 Goal status:  MET  2.  Pt will self report bilateral knee pain no greater than 3/10 for improved comfort and functional ability Baseline: 10/10 at worst 10/15/2023: 9/10 at worst 10/22/23: 5-6/10 Goal status: NOT MET  /IMPROVED  3.  Pt will improve all LE MMT to no less than 4/5 for improved knee stability and decreased pain Baseline: see MMT chart Goal status: MET  4.  Pt will be able to sit >30 minutes  without increase in bilateral knee pain for improved comfort and function Baseline: unable 10/08/23: 30 min max 10/22/23: approx 30 min Goal status: MET   PLAN:  PT FREQUENCY: 1-2x/week  PT DURATION: 8 weeks  PLANNED INTERVENTIONS: 97164- PT Re-evaluation, 97110-Therapeutic exercises, 97530- Therapeutic activity, 97112- Neuromuscular re-education, 97535- Self Care, 02859- Manual therapy, [id number]- Gait training, [phone]- Aquatic Therapy, [phone]- Electrical stimulation (unattended), [phone]- Electrical stimulation (manual), [phone] (1-2 muscles), 20561 (3+ muscles)- Dry Needling, Cryotherapy, and Moist heat    Harlene Persons, PTA 10/22/23 1:00 PM Phone: [phone] Fax: [phone]

## 2023-10-30 NOTE — Progress Notes (Signed)
 Cardiology Office Note    Date:  10/31/2023  ID:  Stacie Gibson, Stacie Gibson 14-Sep-1975, MRN [id number] PCP:  Lorren Greig PARAS, NP  Cardiologist:  None  Electrophysiologist:  None   Chief Complaint: Atypical chest pain  History of Present Illness: .    Stacie Gibson is a 48 y.o. female with visit-pertinent history of iron  deficiency anemia, PTSD, thyromegaly, anxiety, hypertension.  Patient presented to [facility] ED on 05/13/2023 for concern for left-sided chest/shoulder pain that had been ongoing for 1 to 2 weeks.  Patient's troponin was negative, noted to have chronic anemia with hemoglobin of 8.1.  Patient was seen by her PCP and referred to cardiology for EKG indicating incomplete right bundle branch block.  Today patient presents to [facility] regarding atypical chest pain.  Patient reports that for the last few years she has had occasional chest pain that is typically associated with increased emotion, she reports that she will calm and take a few deep breaths and massaged her chest with resolution in symptoms.  She denies any chest pain, tightness or pressure on exertion.  She notes that she previously has had shortness of breath, on review appears to be related to her history of chronic anemia requiring blood transfusions.  She denies any palpitations, lower extremity edema, orthopnea or PND.  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  Patient notes history of hypertension however previously self discontinued medications as she felt they made her urinate more frequently, she does not monitor her blood pressure at home.  Patient also has significant history of iron  deficiency anemia, last hemoglobin 8.1 in May.  She reports that she was previously on an iron  supplement however this resulted in significant constipation and she self  discontinued.  She notes problems with possible uterine fibroids and heavy menstrual periods, she is planning to follow-up with her PCP regarding anemia, encouraged soon follow-up. Patient reports that she is unsure of family history. ROS: .   Today she denies shortness of breath, lower extremity edema, fatigue, palpitations, melena, hematuria, hemoptysis, diaphoresis, weakness, presyncope, syncope, orthopnea, and PND.  All other systems are reviewed and otherwise negative. Studies Reviewed: SABRA   EKG:  EKG is ordered today, personally reviewed, demonstrating  EKG Interpretation Date/Time:  Friday October 31 2023 13:26:28 EDT Ventricular Rate:  67 PR Interval:  182 QRS Duration:  98 QT Interval:  408 QTC Calculation: 431 R Axis:   -37  Text Interpretation: Normal sinus rhythm Left axis deviation Incomplete right bundle branch block When compared with ECG of 13-May-2023 11:22, No significant change was found Confirmed by Isbella Arline [phone]) on 10/31/2023 2:19:28 PM   CV Studies: Cardiac studies reviewed are outlined and summarized above. Otherwise please see EMR for full report.      Current Reported Medications:.    Current Meds  Medication Sig   amLODipine  (NORVASC ) 5 MG tablet Take 1 tablet (5 mg total) by mouth daily.    Physical Exam:    VS:  BP (!) 152/88   Pulse 67   Ht 5' 8 (1.727 m)   Wt 229 lb 12.8 oz (104.2 kg)   SpO2 96%   BMI 34.94 kg/m    Wt Readings from Last 3 Encounters:  10/31/23 229 lb 12.8 oz (104.2 kg)  09/16/23 234 lb (106.1 kg)  05/21/23 231 lb 6.4 oz (  105 kg)    GEN: Well nourished, well developed in no acute distress NECK: No JVD; No carotid bruits CARDIAC: RRR, no murmurs, rubs, gallops RESPIRATORY:  Clear to auscultation without rales, wheezing or rhonchi  ABDOMEN: Soft, non-tender, non-distended EXTREMITIES:  No edema; No acute deformity     Asessement and Plan:.    Atypical chest pain: Patient was presented to [facility] ED in May with  complaints of intermittent chest pain, workup reassuring as noted above.  EKG with evidence of incomplete right bundle branch block.  Today she reports rare episodes of chest discomfort which she correlates more to emotional stress, denies any episodes of chest pain, tightness or pressure on exertion.  She notes that she has to walk a significant number of blocks to her bus stop multiple times a day and tolerates this very well.  She denies any increased shortness of breath, lower extremity edema, orthopnea or PND.  Chest discomfort overall atypical in nature, patient with history of anemia as noted below, will check CBC, patient also with history of hypertension however has not been on her antihypertensives in recent months.  Start amlodipine  as noted below.  Given incomplete right bundle branch block will check echocardiogram, if anemia improves or she starts to have increased episodes of chest discomfort can consider coronary CTA, patient in agreement with this plan.  Reviewed ED precautions.  Start amlodipine  5 mg daily.  Cardiac murmur: Patient noted to have 2/6 systolic murmur. She notes atypical chest pain as noted above, denies shortness of breath, lower extremity edema, orthopnea or pnd. Check echocardiogram as noted above.   Anemia: On chart review patient with significant history for iron  deficiency anemia previous requiring multiple blood transfusions.  Patient notes that she has previously been told she may have uterine fibroids she notes significant bleeding with her menstrual periods, she plans to follow-up with her PCP given history of anemia and no longer being on iron  supplementation. Check CBC.   Hypertension: Blood pressure today 140/90, on recheck was 152/88.  Patient previously has previously been on multiple blood pressure medications, unclear if self discontinued or was not provided any further refills.  She notes that she previously was on hydrochlorothiazide  and would refuse this  medication again as a result of increased urination.  Will start patient on amlodipine  5 mg daily.  Thyroid  goiter: Patient notes history of thyroid  goiter, notes her thyroid  panel has note been checked recently. Check TSH with reflex.    Disposition: F/u with Tyreece Gelles, NP in 6-8 weeks.   Signed, Havard Radigan D Tierney Behl, NP

## 2023-10-31 ENCOUNTER — Ambulatory Visit: Payer: MEDICAID | Attending: Cardiology | Admitting: Cardiology

## 2023-10-31 ENCOUNTER — Encounter: Payer: Self-pay | Admitting: Cardiology

## 2023-10-31 VITALS — BP 152/88 | HR 67 | Ht 68.0 in | Wt 229.8 lb

## 2023-10-31 DIAGNOSIS — R011 Cardiac murmur, unspecified: Secondary | ICD-10-CM

## 2023-10-31 DIAGNOSIS — I10 Essential (primary) hypertension: Secondary | ICD-10-CM | POA: Diagnosis not present

## 2023-10-31 DIAGNOSIS — E010 Iodine-deficiency related diffuse (endemic) goiter: Secondary | ICD-10-CM | POA: Diagnosis present

## 2023-10-31 DIAGNOSIS — R079 Chest pain, unspecified: Secondary | ICD-10-CM

## 2023-10-31 DIAGNOSIS — D509 Iron deficiency anemia, unspecified: Secondary | ICD-10-CM | POA: Diagnosis present

## 2023-10-31 MED ORDER — AMLODIPINE BESYLATE 5 MG PO TABS: 5.0000 mg | ORAL_TABLET | Freq: Every day | ORAL | 3 refills | Status: AC

## 2023-10-31 NOTE — Patient Instructions (Signed)
 Medication Instructions:  START: Amlodipine  5 mg daily. (Take one tablet daily) *If you need a refill on your cardiac medications before your next appointment, please call your pharmacy*  Lab Work: CBC, CMET, TSH w/ Reflex today If you have labs (blood work) drawn today and your tests are completely normal, you will receive your results only by: MyChart Message (if you have MyChart) OR A paper copy in the mail If you have any lab test that is abnormal or we need to change your treatment, we will call you to review the results.  Testing/Procedures: Your physician has requested that you have an echocardiogram. Echocardiography is a painless test that uses sound waves to create images of your heart. It provides your doctor with information about the size and shape of your heart and how well your heart's chambers and valves are working. This procedure takes approximately one hour. There are no restrictions for this procedure. Please do NOT wear cologne, perfume, aftershave, or lotions (deodorant is allowed). Please arrive 15 minutes prior to your appointment time.  Please note: We ask at that you not bring children with you during ultrasound (echo/ vascular) testing. Due to room size and safety concerns, children are not allowed in the ultrasound rooms during exams. Our front office staff cannot provide observation of children in our lobby area while testing is being conducted. An adult accompanying a patient to their appointment will only be allowed in the ultrasound room at the discretion of the ultrasound technician under special circumstances. We apologize for any inconvenience.   Follow-Up: At [facility], you and your health needs are our priority.  As part of our continuing mission to provide you with exceptional heart care, our providers are all part of one team.  This team includes your primary Cardiologist (physician) and Advanced Practice Providers or APPs (Physician Assistants  and Nurse Practitioners) who all work together to provide you with the care you need, when you need it.  Your next appointment:    6-8 wks  Provider:   Katlyn West, NP   We recommend signing up for the patient portal called MyChart.  Sign up information is provided on this After Visit Summary.  MyChart is used to connect with patients for Virtual Visits (Telemedicine).  Patients are able to view lab/test results, encounter notes, upcoming appointments, etc.  Non-urgent messages can be sent to your provider as well.   To learn more about what you can do with MyChart, go to [email or web address].   Other Instructions Follow up with PCP in regarding anemia

## 2023-11-01 LAB — TSH RFX ON ABNORMAL TO FREE T4: TSH: 3.29 u[IU]/mL (ref 0.450–4.500)

## 2023-11-01 LAB — CBC
Hematocrit: 29.7 % — ABNORMAL LOW (ref 34.0–46.6)
Hemoglobin: 8.3 g/dL — ABNORMAL LOW (ref 11.1–15.9)
MCH: 19.7 pg — ABNORMAL LOW (ref 26.6–33.0)
MCHC: 27.9 g/dL — ABNORMAL LOW (ref 31.5–35.7)
MCV: 70 fL — ABNORMAL LOW (ref 79–97)
Platelets: 445 x10E3/uL (ref 150–450)
RBC: 4.22 x10E6/uL (ref 3.77–5.28)
RDW: 19.1 % — ABNORMAL HIGH (ref 11.7–15.4)
WBC: 4.9 x10E3/uL (ref 3.4–10.8)

## 2023-11-01 LAB — COMPREHENSIVE METABOLIC PANEL WITH GFR
ALT: 11 IU/L (ref 0–32)
AST: 21 IU/L (ref 0–40)
Albumin: 4.4 g/dL (ref 3.9–4.9)
Alkaline Phosphatase: 33 IU/L — ABNORMAL LOW (ref 41–116)
BUN/Creatinine Ratio: 13 (ref 9–23)
BUN: 9 mg/dL (ref 6–24)
Bilirubin Total: 1.2 mg/dL (ref 0.0–1.2)
CO2: 23 mmol/L (ref 20–29)
Calcium: 9.3 mg/dL (ref 8.7–10.2)
Chloride: 105 mmol/L (ref 96–106)
Creatinine, Ser: 0.67 mg/dL (ref 0.57–1.00)
Globulin, Total: 3.5 g/dL (ref 1.5–4.5)
Glucose: 84 mg/dL (ref 70–99)
Potassium: 4.2 mmol/L (ref 3.5–5.2)
Sodium: 140 mmol/L (ref 134–144)
Total Protein: 7.9 g/dL (ref 6.0–8.5)
eGFR: 107 mL/min/1.73 (ref >59)

## 2023-11-02 ENCOUNTER — Ambulatory Visit: Payer: Self-pay | Admitting: Cardiology

## 2023-11-02 ENCOUNTER — Encounter: Payer: Self-pay | Admitting: Cardiology

## 2023-11-02 DIAGNOSIS — Z1211 Encounter for screening for malignant neoplasm of colon: Secondary | ICD-10-CM

## 2023-11-06 NOTE — Telephone Encounter (Signed)
 Copied from CRM #[id number]. Topic: Clinical - Request for Lab/Test Order >> Nov 06, 2023  3:27 PM Willma R wrote: Reason for CRM: Patient states MyChart says she is past due for her colon screening. Would like an order placed for the Cologuard.   Patient can be reached at [phone]

## 2023-11-07 NOTE — Addendum Note (Signed)
 Addended by: LORREN GREIG PARAS on: 11/07/2023 12:23 PM   Modules accepted: Orders

## 2023-11-10 ENCOUNTER — Encounter: Payer: Self-pay | Admitting: Radiology

## 2023-11-29 LAB — COLOGUARD: COLOGUARD: NEGATIVE

## 2023-12-15 ENCOUNTER — Ambulatory Visit: Payer: MEDICAID | Admitting: Family

## 2023-12-17 ENCOUNTER — Ambulatory Visit (HOSPITAL_COMMUNITY): Payer: MEDICAID

## 2023-12-19 ENCOUNTER — Ambulatory Visit: Payer: MEDICAID | Admitting: Cardiology

## 2024-01-12 ENCOUNTER — Encounter: Payer: MEDICAID | Admitting: Family

## 2024-01-12 NOTE — Progress Notes (Signed)
 Erroneous encounter-disregard

## 2024-01-15 ENCOUNTER — Telehealth: Payer: Self-pay | Admitting: Family

## 2024-01-15 NOTE — Telephone Encounter (Signed)
 Copied from CRM [id number]. Topic: General - Other >> Jan 15, 2024 11:44 AM Myrick T wrote: Reason for CRM: Manuelita from [facility] called to speak with a manager in reference to a grievance patient put in with the practice. Please f/u with Manuelita at [phone].

## 2024-01-16 ENCOUNTER — Ambulatory Visit: Payer: MEDICAID | Admitting: Cardiology

## 2024-01-16 ENCOUNTER — Telehealth: Payer: Self-pay | Admitting: Cardiology

## 2024-01-16 NOTE — Progress Notes (Unsigned)
" ° °  Cardiology Office Note    Date:  01/16/2024  ID:  Stacie Gibson, DOB [birth year], MRN [id number] PCP:  Stacie Greig PARAS, NP  Cardiologist:  None  Electrophysiologist:  None   Chief Complaint: ***  History of Present Illness: .    Stacie Gibson is a 49 y.o. female with visit-pertinent history of ***  Labwork independently reviewed:   ROS: .   *** denies chest pain, shortness of breath, lower extremity edema, fatigue, palpitations, melena, hematuria, hemoptysis, diaphoresis, weakness, presyncope, syncope, orthopnea, and PND.  All other systems are reviewed and otherwise negative.  Studies Reviewed: SABRA    EKG:  EKG is ordered today, personally reviewed, demonstrating ***     CV Studies: Cardiac studies reviewed are outlined and summarized above. Otherwise please see EMR for full report.      Current Reported Medications:.    Active Medications[1]  Physical Exam:    VS:  There were no vitals taken for this visit.   Wt Readings from Last 3 Encounters:  10/31/23 229 lb 12.8 oz (104.2 kg)  09/16/23 234 lb (106.1 kg)  05/21/23 231 lb 6.4 oz (105 kg)    GEN: Well nourished, well developed in no acute distress NECK: No JVD; No carotid bruits CARDIAC: ***RRR, no murmurs, rubs, gallops RESPIRATORY:  Clear to auscultation without rales, wheezing or rhonchi  ABDOMEN: Soft, non-tender, non-distended EXTREMITIES:  No edema; No acute deformity     Asessement and Plan:.     ***     Disposition: F/u with ***  Signed, Chantia Amalfitano D Saranya Harlin, NP      [1]  No outpatient medications have been marked as taking for the 01/16/24 encounter (Appointment) with Ahava Kissoon D, NP.   "

## 2024-01-16 NOTE — Telephone Encounter (Signed)
 Patient presented today for follow up appointment to review her echocardiogram, unfortunately she had to cancel her previous appointment as she was sick.  Her echocardiogram has been rescheduled for later this month.  Discussed with patient that we could complete office visit today however would be unable to review echocardiogram as it has not yet been completed, offered patient to reschedule appointment today to after her echocardiogram so this could be reviewed.  Patient reports that she has not had any significant changes since her last office visit and was agreeable to rescheduling visit today to after her echocardiogram has been completed.  Patient was appreciative.

## 2024-01-29 ENCOUNTER — Ambulatory Visit (HOSPITAL_COMMUNITY)
Admission: RE | Admit: 2024-01-29 | Discharge: 2024-01-29 | Disposition: A | Discharge status: Home / Self Care | Payer: MEDICAID | Source: Ambulatory Visit | Attending: Cardiology | Admitting: Cardiology

## 2024-01-29 DIAGNOSIS — R011 Cardiac murmur, unspecified: Secondary | ICD-10-CM | POA: Insufficient documentation

## 2024-01-30 LAB — ECHOCARDIOGRAM COMPLETE
Area-P 1/2: 3.39 cm2
P 1/2 time: 296 ms
S' Lateral: 2.5 cm

## 2024-02-02 NOTE — Progress Notes (Unsigned)
 "  Cardiology Office Note   Date:  02/03/2024  ID:  Stacie Gibson, Stacie Gibson [birth year], MRN [id number] PCP:  Jaycee Greig PARAS, NP  Cardiologist:  None  Electrophysiologist:  None   Chief Complaint: Follow-up for hypertension History of Present Illness: .   Stacie Gibson is a 50 y.o. female with visit-pertinent history of  iron  deficiency anemia, PTSD, thyromegaly, anxiety, hypertension.   [facility] presented to [facility] on 05/13/2023 for concern for left-sided chest/shoulder pain that had been ongoing for 1 to 2 weeks.  Patient's troponin was negative, noted to have chronic anemia with hemoglobin of 8.1.  Patient was seen by her PCP and referred to cardiology for EKG indicating incomplete right bundle branch block.  Patient was [facility] in clinic [facility].  Patient reported that for the prior few years she had occasional chest pain typically associate with increased emotion, reported that when she was calm and took a few deep breaths to massage her chest her discomfort resolved.  She denied any chest pain, tightness or pressure on exertion.  Patient reported she previously had shortness of breath, appeared to be related to her history of chronic anemia requiring blood transfusions.  Patient noted problems with possible uterine fibroids and heavy menstrual periods, encourage patient to closely follow-up with her PCP regarding her anemia.  Echocardiogram was ordered for murmur noted.  Patient reported she was previously on multiple blood pressure medications, was restarted on amlodipine  5 mg daily.  Echocardiogram on 01/29/2024 indicated LVEF 60 to 65%, no RWMA, diastolic pressures were normal, RV systolic function size was normal, normal PASP, LA was severely dilated, no evidence of mitral valve regurgitation, no evidence of mitral stenosis, aortic valve regurgitation was mild, no stenosis present.  Today she presents for follow-up.  She reports that she  has been doing well. Notes some occassional shortness of breath that occurs with prolonged exertion, denies shortness of breath at rest.  She denies any increased lower extremity edema, without near PND.  She does note problems with significant anxiety, during increased attacks can have some chest discomfort, quickly resolves with calming.  She denies any chest discomfort, tightness or pain on exertion.  She regularly walks multiple blocks to her bus stop without any anginal symptoms.  Patient does note occasional episodes of feeling of skipped heartbeats or feeling as if her heart rate increases for a few seconds, denies any associated symptoms. Patient request referral to primary care. ROS: .   Today she denies lower extremity edema, fatigue, melena, hematuria, hemoptysis, diaphoresis, weakness, presyncope, syncope, orthopnea, and PND.  All other systems are reviewed and otherwise negative. Studies Reviewed: SABRA   EKG:  EKG is not ordered today.  CV Studies: Cardiac studies reviewed are outlined and summarized above. Otherwise please see EMR for full report. Cardiac Studies & Procedures   ______________________________________________________________________________________________     ECHOCARDIOGRAM  ECHOCARDIOGRAM COMPLETE 01/29/2024  Narrative ECHOCARDIOGRAM REPORT    Patient Name:   Stacie Gibson Date of Exam: 01/29/2024 Medical Rec #:  [id number]      Height:       68.0 in Accession #:    [id number]     Weight:       229.8 lb Date of Birth:  Jun 28, 1975     BSA:          2.168 m Patient Age:    48 years       BP:  152/88 mmHg Patient Gender: F              HR:           85 bpm. Exam Location:  [address]  Procedure: 2D Echo, 3D Echo, Cardiac Doppler and Color Doppler (Both Spectral and Color Flow Doppler were utilized during procedure).  Indications:    R94.31 Abnormal EKG R01.1 Murmur  History:        Patient has no prior history of Echocardiogram  examinations. Abnormal ECG, Signs/Symptoms:Murmur and Chest Pain; Risk Factors:Family History of Coronary Artery Disease and Hypertension. Incomplete Right Bundle Branch Block.  Sonographer:    Heather Hawks RDCS Referring Phys: Oneka Parada D Yovanna Cogan  IMPRESSIONS   1. Left ventricular ejection fraction, by estimation, is 60 to 65%. The left ventricle has normal function. The left ventricle has no regional wall motion abnormalities. Left ventricular diastolic parameters were normal. 2. Right ventricular systolic function is normal. The right ventricular size is normal. There is normal pulmonary artery systolic pressure. 3. Left atrial size was severely dilated. 4. The mitral valve is normal in structure. No evidence of mitral valve regurgitation. No evidence of mitral stenosis. 5. The aortic valve is tricuspid. Aortic valve regurgitation is mild. No aortic stenosis is present. 6. The inferior vena cava is normal in size with greater than 50% respiratory variability, suggesting right atrial pressure of 3 mmHg.  FINDINGS Left Ventricle: Left ventricular ejection fraction, by estimation, is 60 to 65%. The left ventricle has normal function. The left ventricle has no regional wall motion abnormalities. The left ventricular internal cavity size was normal in size. There is no left ventricular hypertrophy. Left ventricular diastolic parameters were normal. Indeterminate filling pressures.  Right Ventricle: The right ventricular size is normal. No increase in right ventricular wall thickness. Right ventricular systolic function is normal. There is normal pulmonary artery systolic pressure. The tricuspid regurgitant velocity is 2.07 m/s, and with an assumed right atrial pressure of 3 mmHg, the estimated right ventricular systolic pressure is 20.1 mmHg.  Left Atrium: Left atrial size was severely dilated.  Right Atrium: Right atrial size was normal in size.  Pericardium: There is no evidence of  pericardial effusion.  Mitral Valve: The mitral valve is normal in structure. No evidence of mitral valve regurgitation. No evidence of mitral valve stenosis.  Tricuspid Valve: The tricuspid valve is normal in structure. Tricuspid valve regurgitation is trivial. No evidence of tricuspid stenosis.  Aortic Valve: The aortic valve is tricuspid. Aortic valve regurgitation is mild. Aortic regurgitation PHT measures 296 msec. No aortic stenosis is present.  Pulmonic Valve: The pulmonic valve was normal in structure. Pulmonic valve regurgitation is not visualized. No evidence of pulmonic stenosis.  Aorta: The aortic root is normal in size and structure.  Venous: The inferior vena cava is normal in size with greater than 50% respiratory variability, suggesting right atrial pressure of 3 mmHg.  IAS/Shunts: No atrial level shunt detected by color flow Doppler.   LEFT VENTRICLE PLAX 2D LVIDd:         4.30 cm   Diastology LVIDs:         2.50 cm   LV e' medial:    7.34 cm/s LV PW:         0.80 cm   LV E/e' medial:  12.0 LV IVS:        0.80 cm   LV e' lateral:   12.10 cm/s LVOT diam:     2.30 cm   LV E/e'  lateral: 7.3 LV SV:         106 LV SV Index:   49 LVOT Area:     4.15 cm LV IVRT:       100 msec   RIGHT VENTRICLE RV Basal diam:  3.80 cm     PULMONARY VEINS RV S prime:     17.00 cm/s  A Reversal Velocity: 59.10 cm/s TAPSE (M-mode): 3.4 cm      Diastolic Velocity:  62.60 cm/s RVSP:           20.1 mmHg   S/D Velocity:        1.20 Systolic Velocity:   73.40 cm/s  LEFT ATRIUM           Index        RIGHT ATRIUM           Index LA diam:      3.60 cm 1.66 cm/m   RA Pressure: 3.00 mmHg LA Vol (A2C): 78.5 ml 36.21 ml/m  RA Area:     15.30 cm LA Vol (A4C): 46.6 ml 21.49 ml/m  RA Volume:   40.80 ml  18.82 ml/m AORTIC VALVE LVOT Vmax:   132.00 cm/s LVOT Vmean:  89.550 cm/s LVOT VTI:    0.254 m AI PHT:      296 msec  AORTA Ao Root diam: 3.50 cm Ao Asc diam:  3.55 cm  MITRAL VALVE                TRICUSPID VALVE MV Area (PHT): cm         TR Peak grad:   17.1 mmHg MV Decel Time: 224 msec    TR Vmax:        207.00 cm/s MV E velocity: 88.25 cm/s  Estimated RAP:  3.00 mmHg MV A velocity: 64.50 cm/s  RVSP:           20.1 mmHg MV E/A ratio:  1.37 SHUNTS Systemic VTI:  0.25 m Systemic Diam: 2.30 cm  Stacie Scarce MD Electronically signed by Stacie Scarce MD Signature Date/Time: 01/30/2024/5:22:21 PM    Final          ______________________________________________________________________________________________       Current Reported Medications:.    Active Medications[1]  Physical Exam:    VS:  BP (!) 142/88   Pulse 66   Ht 5' 8 (1.727 m)   Wt 231 lb 6.4 oz (105 kg)   SpO2 99%   BMI 35.18 kg/m    Wt Readings from Last 3 Encounters:  02/03/24 231 lb 6.4 oz (105 kg)  10/31/23 229 lb 12.8 oz (104.2 kg)  09/16/23 234 lb (106.1 kg)    GEN: Well nourished, well developed in no acute distress NECK: No JVD; No carotid bruits CARDIAC: RRR, no murmurs, rubs, gallops RESPIRATORY:  Clear to auscultation without rales, wheezing or rhonchi  ABDOMEN: Soft, non-tender, non-distended EXTREMITIES:  No edema; No acute deformity     Asessement and Plan:.    DOE/palpitations/atypical chest pain: [facility] to [facility] in May with complaints of intermittent chest pain, workup reassuring as noted above. EKG with evidence of incomplete right bundle branch block.  Today patient reports chest discomfort associated with anxiety, she does not believe that this is related to her heart in nature, she denies chest discomfort, tightness or pressure on exertion.  She does note some dyspnea on significant exertion however feel this is likely related to her history of anemia please see below.  She does note  occasional palpitations described as skipped beats, given severe LA dilation she is agreeable to 1 week nonlife Zio monitor.  Also stressed importance of good  blood pressure control.  Patient verbalized understanding.  Reviewed ED precautions.  Anemia: Patient with significant history for iron  deficiency anemia, previously required multiple blood transfusions.  Patient has been told she may have uterine fibroids and notes significant bleeding with her menstrual periods.  Encouraged patient to follow-up with PCP, will defer possible referral to hematology to PCP.  HTN: Blood pressure today 142/88.  Patient reports she did not take her amlodipine  last night, notes inconsistency with taking medication.  Stressed with patient importance of good blood pressure control given severe LA dilation.  Patient verbalized understanding and plans to resume amlodipine  5 mg nightly.  Thyroid  goiter: Patient with reported history of thyroid  goiter, TSH normal in 10/2023.  Defer to PCP for ongoing management.   Disposition: F/u with Dr. Kate in three months.   Signed, Sharin Altidor D Ky Rumple, NP       [1]  Current Meds  Medication Sig   amLODipine  (NORVASC ) 5 MG tablet Take 1 tablet (5 mg total) by mouth daily.   "

## 2024-02-03 ENCOUNTER — Ambulatory Visit: Payer: MEDICAID | Attending: Cardiology | Admitting: Cardiology

## 2024-02-03 ENCOUNTER — Encounter: Payer: Self-pay | Admitting: Cardiology

## 2024-02-03 ENCOUNTER — Ambulatory Visit: Payer: MEDICAID

## 2024-02-03 VITALS — BP 142/88 | HR 66 | Ht 68.0 in | Wt 231.4 lb

## 2024-02-03 DIAGNOSIS — R011 Cardiac murmur, unspecified: Secondary | ICD-10-CM

## 2024-02-03 DIAGNOSIS — D509 Iron deficiency anemia, unspecified: Secondary | ICD-10-CM | POA: Insufficient documentation

## 2024-02-03 DIAGNOSIS — R002 Palpitations: Secondary | ICD-10-CM

## 2024-02-03 DIAGNOSIS — I10 Essential (primary) hypertension: Secondary | ICD-10-CM | POA: Diagnosis not present

## 2024-02-03 NOTE — Patient Instructions (Addendum)
 Medication Instructions:  Your physician recommends that you continue on your current medications as directed. Please refer to the Current Medication list given to you today.  *If you need a refill on your cardiac medications before your next appointment, please call your pharmacy*  Lab Work: NONE If you have labs (blood work) drawn today and your tests are completely normal, you will receive your results only by: MyChart Message (if you have MyChart) OR A paper copy in the mail If you have any lab test that is abnormal or we need to change your treatment, we will call you to review the results.  Testing/Procedures: NONE  Follow-Up: At [facility], you and your health needs are our priority.  As part of our continuing mission to provide you with exceptional heart care, our providers are all part of one team.  This team includes your primary Cardiologist (physician) and Advanced Practice Providers or APPs (Physician Assistants and Nurse Practitioners) who all work together to provide you with the care you need, when you need it.  Your next appointment:   3 month(s)  Provider:   Dr. Kate  We recommend signing up for the patient portal called MyChart.  Sign up information is provided on this After Visit Summary.  MyChart is used to connect with patients for Virtual Visits (Telemedicine).  Patients are able to view lab/test results, encounter notes, upcoming appointments, etc.  Non-urgent messages can be sent to your provider as well.   To learn more about what you can do with MyChart, go to [email or web address].   Other Instructions ZIO XT- Long Term Monitor Instructions  Your physician has requested you wear a ZIO patch monitor for _7___ days.   This is a single patch monitor. [employer] supplies one patch monitor per enrollment. Additional  stickers are not available. Please do not apply patch if you will be having a Nuclear Stress Test,  Echocardiogram, Cardiac CT,  MRI, or Chest Xray during the period you would be wearing the  monitor. The patch cannot be worn during these tests. You cannot remove and re-apply the  ZIO XT patch monitor.   Your ZIO patch monitor will be mailed 3 day USPS to your address on file. It may take 3-5 days  to receive your monitor after you have been enrolled.   Once you have received your monitor, please review the enclosed instructions. Your monitor  has already been registered assigning a specific monitor serial # to you.     Billing and Patient Assistance Program Information  We have supplied [employer] with any of your insurance information on file for billing purposes.  [employer] offers a sliding scale Patient Assistance Program for patients that do not have  insurance, or whose insurance does not completely cover the cost of the ZIO monitor.  You must apply for the Patient Assistance Program to qualify for this discounted rate.   To apply, please call [employer] at [phone], select option 4, select option 2, ask to apply for  Patient Assistance Program. Meredeth will ask your household income, and how many people  are in your household. They will quote your out-of-pocket cost based on that information.  [employer] will also be able to set up a 77-month, interest-free payment plan if needed.     Applying the monitor  Shave hair from upper left chest.  Hold abrader disc by orange tab. Rub abrader in 40 strokes over the upper left chest as  indicated in your monitor instructions.  Clean area  with 4 enclosed alcohol pads. Let dry.  Apply patch as indicated in monitor instructions. Patch will be placed under collarbone on left  side of chest with arrow pointing upward.  Rub patch adhesive wings for 2 minutes. Remove white label marked 1. Remove the white  label marked 2. Rub patch adhesive wings for 2 additional minutes.  While looking in a mirror, press and release button in center of patch. A small green light will   flash 3-4 times. This will be your only indicator that the monitor has been turned on.  Do not shower for the first 24 hours. You may shower after the first 24 hours.  Press the button if you feel a symptom. You will hear a small click. Record Date, Time and  Symptom in the Patient Logbook.  When you are ready to remove the patch, follow instructions on the last 2 pages of Patient  Logbook. Stick patch monitor onto the last page of Patient Logbook.   Place Patient Logbook in the blue and white box. Use locking tab on box and tape box closed  securely. The blue and white box has prepaid postage on it. Please place it in the mailbox as  soon as possible. Your physician should have your test results approximately 7 days after the  monitor has been mailed back to [facility].   Call [facility] Customer Care at [phone] if you have questions regarding  your ZIO XT patch monitor. Call them immediately if you see an orange light blinking on your  monitor.   If your monitor falls off in less than 4 days, contact our Monitor department at [phone].   If your monitor becomes loose or falls off after 4 days call [employer] at [phone] for  suggestions on securing your monitor.

## 2024-02-03 NOTE — Progress Notes (Unsigned)
 Enrolled for [employer] to mail a ZIO XT long term holter monitor to the patients address on file.  ? ?Dr. Bjorn Pippin to read. ?

## 2024-02-07 ENCOUNTER — Emergency Department (HOSPITAL_COMMUNITY)
Admission: EM | Admit: 2024-02-07 | Discharge: 2024-02-07 | Disposition: A | Discharge status: Home / Self Care | Payer: MEDICAID | Attending: Emergency Medicine | Admitting: Emergency Medicine

## 2024-02-07 ENCOUNTER — Encounter (HOSPITAL_COMMUNITY): Payer: Self-pay

## 2024-02-07 ENCOUNTER — Other Ambulatory Visit: Payer: Self-pay

## 2024-02-07 DIAGNOSIS — I10 Essential (primary) hypertension: Secondary | ICD-10-CM | POA: Diagnosis not present

## 2024-02-07 DIAGNOSIS — Z79899 Other long term (current) drug therapy: Secondary | ICD-10-CM | POA: Diagnosis not present

## 2024-02-07 DIAGNOSIS — F419 Anxiety disorder, unspecified: Secondary | ICD-10-CM | POA: Insufficient documentation

## 2024-02-07 MED ORDER — HYDROXYZINE HCL 25 MG PO TABS: 25.0000 mg | ORAL_TABLET | Freq: Four times a day (QID) | ORAL | 0 refills | Status: AC | PRN
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
## 2024-02-07 NOTE — ED Notes (Signed)
 Patient dc by RN. Patient ambulatory to lobby at time of discharge.
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
## 2024-02-07 NOTE — Discharge Instructions (Addendum)
 May take hydroxyzine  every 6 hours as needed for anxiety.  Continue to follow-up with primary care doctor for further medications as needed.  Continue to follow-up with cardiology as scheduled.  Return to ED if any symptoms worsen including worsening anxiety, chest pain, difficulty breathing, any concerns at home.

## 2024-02-13 ENCOUNTER — Ambulatory Visit: Payer: Self-pay

## 2024-02-13 VITALS — BP 121/80 | HR 98 | Wt 241.0 lb

## 2024-02-13 DIAGNOSIS — Z0000 Encounter for general adult medical examination without abnormal findings: Secondary | ICD-10-CM

## 2024-02-13 DIAGNOSIS — D649 Anemia, unspecified: Secondary | ICD-10-CM

## 2024-02-13 DIAGNOSIS — D509 Iron deficiency anemia, unspecified: Secondary | ICD-10-CM

## 2024-02-13 DIAGNOSIS — I10 Essential (primary) hypertension: Secondary | ICD-10-CM

## 2024-02-13 LAB — POCT GLYCOSYLATED HEMOGLOBIN (HGB A1C): Hemoglobin A1C: 5.5 % (ref 4.0–5.6)

## 2024-02-13 NOTE — Patient Instructions (Signed)
 Thank you for visiting the clinic today, it was good to see you!  Our plans for today: - Labs: iron , TIBC, ferritin, CBC to check anemia. We will also check your cholesterol, blood sugar, and electrolytes. I will send you a MyChart message or a letter if results are normal. Otherwise, I will give you a call. - Take a Vitamin D  supplement - dose should be 1000 international units  and you can get this at your local pharmacy - Consider getting a colonoscopy   Please follow-up in 02/20/24 at 2:50 PM. Make sure to bring ALL of your medications with you to every visit.  - Pap smear - Discuss anxiety medications  Please arrive 15 minutes PRIOR to your next scheduled appointment time! If you do not, this affects OTHER patients' care.  For any questions, please call the office at [phone] or send me a message in MyChart.  It was a pleasure to take care of you today. Have a great day!  Paitlyn Mcclatchey, DO [facility] Resident, PGY-1  -----------------------------------------------------------------------------  Do you need your medications delivered to your home?   Well send your prescription to the [facility] for delivery.          Address: [address] [place], [place], [place] [address]          Phone: [phone]  Please call the [facility] to speak with a pharmacist and set up your home medication delivery. If you have any questions, feel free to contact us  -- were happy to help!  Other [facility]acies that offer affordable prices on both prescriptions and over-the-counter items, as well as convenient services like vaccinations, are  [facility], at [facility]         Address: [address], [place], [place] [address]         Phone: [phone]  [facility], located in the [facility]        Address: [address], [place], [place] [address]        Phone:  [phone]  [facility], at [facility]       Address: [address], [place], [place] [address]       Phone: [phone]  [facility], at [facility]       Address: [address], [address], [place], [place] [address]       Phone: [phone]  -----------------------------------------------------------------------------  Food Resources SNAP/ Food benefits - [place] [phone] [place]: [address] [place]: [address] [facility] [facility]) Nutrition program [place]: [phone] [place]: [phone] [facility] Helping [facility] - Free Produce & Food (Every Thursday 9AM to 11:00AM) [employer] [phone] [address], [place], [place]: [address] [phone] [place]: [address]; [phone]  The [facility] [employer] app, formally [employer], connects those living in [place], [place] & surrounding areas with healthy food options & access to emergency resources. This app aims to alleviate some of the barriers to food access by making the many ways people in [place], [place] & surrounding areas can access food resources publicly available.    This app is the product of the [employer], whose mission is to coordinate and improve the effectiveness of entities in [place] [employer]  focused on alleviating hunger by creating and executing citywide and neighborhood-focused initiatives to develop more just and sustainable food systems.
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
## 2024-02-20 ENCOUNTER — Ambulatory Visit: Payer: Self-pay

## 2024-05-18 ENCOUNTER — Ambulatory Visit: Payer: MEDICAID | Admitting: Cardiology
